# Patient Record
Sex: Male | Born: 1935 | Race: White | Hispanic: No | Smoking: Never smoker
Health system: Southern US, Community
[De-identification: ages and names within clinical notes are randomized; demographics above are authoritative.]

## PROBLEM LIST (undated history)

## (undated) DIAGNOSIS — F0390 Unspecified dementia without behavioral disturbance: Secondary | ICD-10-CM

## (undated) DIAGNOSIS — R011 Cardiac murmur, unspecified: Secondary | ICD-10-CM

## (undated) DIAGNOSIS — N400 Enlarged prostate without lower urinary tract symptoms: Secondary | ICD-10-CM

## (undated) DIAGNOSIS — E785 Hyperlipidemia, unspecified: Secondary | ICD-10-CM

## (undated) DIAGNOSIS — I10 Essential (primary) hypertension: Secondary | ICD-10-CM

## (undated) DIAGNOSIS — I2510 Atherosclerotic heart disease of native coronary artery without angina pectoris: Secondary | ICD-10-CM

## (undated) HISTORY — DX: Atherosclerotic heart disease of native coronary artery without angina pectoris: I25.10

## (undated) HISTORY — DX: Benign prostatic hyperplasia without lower urinary tract symptoms: N40.0

## (undated) HISTORY — DX: Essential (primary) hypertension: I10

## (undated) HISTORY — DX: Cardiac murmur, unspecified: R01.1

## (undated) HISTORY — PX: NO PAST SURGERIES: SHX2092

---

## 2012-12-14 ENCOUNTER — Ambulatory Visit: Payer: Self-pay

## 2013-07-27 ENCOUNTER — Encounter: Payer: Self-pay | Admitting: *Deleted

## 2013-08-08 ENCOUNTER — Ambulatory Visit: Payer: Self-pay | Admitting: Podiatry

## 2013-08-22 ENCOUNTER — Encounter: Payer: Self-pay | Admitting: Podiatry

## 2013-08-22 ENCOUNTER — Ambulatory Visit (INDEPENDENT_AMBULATORY_CARE_PROVIDER_SITE_OTHER): Payer: Medicare Other | Admitting: Podiatry

## 2013-08-22 VITALS — BP 145/92 | HR 65 | Resp 18 | Ht 70.87 in | Wt 165.0 lb

## 2013-08-22 DIAGNOSIS — B351 Tinea unguium: Secondary | ICD-10-CM

## 2013-08-22 DIAGNOSIS — M79609 Pain in unspecified limb: Secondary | ICD-10-CM

## 2013-08-22 NOTE — Progress Notes (Signed)
   Subjective:    Patient ID: Cody Chaney, male    DOB: [birth year], 78 y.o.   MRN: [id number]  HPI Pt is unaware of what the appointment is for, also caregiver that is with the patient has no idea why he is here, he is scheduled with another foot doctor next week  They think its for his painful long fungal toenails  Review of Systems     Objective:   Physical Exam I have reviewed his past history medications allergies surgical history. Review of systems noncontributory at this point. Pulses are palpable bilateral. Neurologic sensorium is intact bilateral. Deep tendon reflexes are intact bilateral. Muscle strength is 5 over 5 dorsiflexors plantar flexors inverters everters all intrinsic musculature is intact. Orthopedic evaluation demonstrates hammertoe deformities 1 through 5 bilateral. Nails are thick yellow dystrophic onychomycotic. Nails are also painful.        Assessment & Plan:  Assessment: Pain in limb secondary to onychomycosis 1 through 5 bilateral.  Plan: Debridement of nails 1 through 5 bilateral is cover service secondary to pain.

## 2013-11-21 ENCOUNTER — Ambulatory Visit (INDEPENDENT_AMBULATORY_CARE_PROVIDER_SITE_OTHER): Payer: Medicare Other | Admitting: Podiatry

## 2013-11-21 VITALS — Resp 16 | Ht 69.0 in | Wt 149.0 lb

## 2013-11-21 DIAGNOSIS — M79609 Pain in unspecified limb: Secondary | ICD-10-CM

## 2013-11-21 DIAGNOSIS — B351 Tinea unguium: Secondary | ICD-10-CM

## 2013-11-21 NOTE — Progress Notes (Signed)
He presents today with a chief complaint of painful elongated toenails.  Objective: Pulses are strongly palpable bilateral. Nails are thick yellow dystrophic with mycotic and painful palpation. There no open lesions.  Assessment: Pain in limb secondary to onychomycosis 1 through 5 bilateral.  Plan: Debridement of nails 1 through 5 bilateral covered service secondary to pain.

## 2014-03-04 ENCOUNTER — Ambulatory Visit: Payer: Medicare Other | Admitting: Podiatry

## 2015-12-01 ENCOUNTER — Emergency Department
Admission: EM | Admit: 2015-12-01 | Discharge: 2015-12-01 | Disposition: A | Discharge status: Home / Self Care | Payer: Medicare Other | Attending: Emergency Medicine | Admitting: Emergency Medicine

## 2015-12-01 ENCOUNTER — Emergency Department: Payer: Medicare Other

## 2015-12-01 ENCOUNTER — Encounter: Payer: Self-pay | Admitting: Emergency Medicine

## 2015-12-01 DIAGNOSIS — I2510 Atherosclerotic heart disease of native coronary artery without angina pectoris: Secondary | ICD-10-CM | POA: Diagnosis not present

## 2015-12-01 DIAGNOSIS — I10 Essential (primary) hypertension: Secondary | ICD-10-CM | POA: Diagnosis not present

## 2015-12-01 DIAGNOSIS — Z7982 Long term (current) use of aspirin: Secondary | ICD-10-CM | POA: Diagnosis not present

## 2015-12-01 DIAGNOSIS — Z79899 Other long term (current) drug therapy: Secondary | ICD-10-CM | POA: Diagnosis not present

## 2015-12-01 DIAGNOSIS — R531 Weakness: Secondary | ICD-10-CM | POA: Insufficient documentation

## 2015-12-01 DIAGNOSIS — F0390 Unspecified dementia without behavioral disturbance: Secondary | ICD-10-CM | POA: Diagnosis not present

## 2015-12-01 DIAGNOSIS — W19XXXA Unspecified fall, initial encounter: Secondary | ICD-10-CM

## 2015-12-01 DIAGNOSIS — N390 Urinary tract infection, site not specified: Secondary | ICD-10-CM

## 2015-12-01 LAB — URINALYSIS COMPLETE WITH MICROSCOPIC (ARMC ONLY)
Bilirubin Urine: NEGATIVE
GLUCOSE, UA: NEGATIVE mg/dL
Ketones, ur: NEGATIVE mg/dL
Nitrite: POSITIVE — AB
PH: 7 (ref 5.0–8.0)
PROTEIN: NEGATIVE mg/dL
SPECIFIC GRAVITY, URINE: 1.006 (ref 1.005–1.030)
SQUAMOUS EPITHELIAL / LPF: NONE SEEN

## 2015-12-01 LAB — CBC WITH DIFFERENTIAL/PLATELET
BASOS ABS: 0 10*3/uL (ref 0–0.1)
BASOS PCT: 0 %
Eosinophils Absolute: 0 10*3/uL (ref 0–0.7)
Eosinophils Relative: 0 %
HEMATOCRIT: 36.3 % — AB (ref 40.0–52.0)
Hemoglobin: 12.1 g/dL — ABNORMAL LOW (ref 13.0–18.0)
LYMPHS ABS: 1.2 10*3/uL (ref 1.0–3.6)
LYMPHS PCT: 10 %
MCH: 29 pg (ref 26.0–34.0)
MCHC: 33.3 g/dL (ref 32.0–36.0)
MCV: 87.3 fL (ref 80.0–100.0)
MONOS PCT: 5 %
Monocytes Absolute: 0.6 10*3/uL (ref 0.2–1.0)
NEUTROS ABS: 9.7 10*3/uL — AB (ref 1.4–6.5)
Neutrophils Relative %: 85 %
PLATELETS: 190 10*3/uL (ref 150–440)
RBC: 4.16 MIL/uL — ABNORMAL LOW (ref 4.40–5.90)
RDW: 15.7 % — AB (ref 11.5–14.5)
WBC: 11.5 10*3/uL — ABNORMAL HIGH (ref 3.8–10.6)

## 2015-12-01 LAB — COMPREHENSIVE METABOLIC PANEL
ALBUMIN: 3.5 g/dL (ref 3.5–5.0)
ALT: 15 U/L — AB (ref 17–63)
AST: 26 U/L (ref 15–41)
Alkaline Phosphatase: 87 U/L (ref 38–126)
Anion gap: 8 (ref 5–15)
BILIRUBIN TOTAL: 0.7 mg/dL (ref 0.3–1.2)
BUN: 20 mg/dL (ref 6–20)
CHLORIDE: 107 mmol/L (ref 101–111)
CO2: 28 mmol/L (ref 22–32)
CREATININE: 1.01 mg/dL (ref 0.61–1.24)
Calcium: 9 mg/dL (ref 8.9–10.3)
GFR calc Af Amer: >60 mL/min (ref >60)
GLUCOSE: 96 mg/dL (ref 65–99)
Potassium: 4.3 mmol/L (ref 3.5–5.1)
Sodium: 143 mmol/L (ref 135–145)
Total Protein: 7.5 g/dL (ref 6.5–8.1)

## 2015-12-01 LAB — TROPONIN I: Troponin I: <0.03 ng/mL (ref <0.031)

## 2015-12-01 LAB — BRAIN NATRIURETIC PEPTIDE: B NATRIURETIC PEPTIDE 5: 114 pg/mL — AB (ref 0.0–100.0)

## 2015-12-01 MED ORDER — CEPHALEXIN 500 MG PO CAPS: 500.0000 mg | ORAL_CAPSULE | Freq: Four times a day (QID) | ORAL | Status: DC

## 2015-12-01 MED ORDER — SODIUM CHLORIDE 0.9 % IV BOLUS (SEPSIS)
500.0000 mL | Freq: Once | INTRAVENOUS | Status: AC
  Administered 2015-12-01: 500 mL via INTRAVENOUS

## 2015-12-01 MED ORDER — SODIUM CHLORIDE 0.9 % IV BOLUS (SEPSIS)
500.0000 mL | Freq: Once | INTRAVENOUS | Status: AC
Start: 2015-12-01 — End: 2015-12-01
  Administered 2015-12-01: 500 mL via INTRAVENOUS

## 2015-12-01 MED ORDER — CEPHALEXIN 500 MG PO CAPS: 500.0000 mg | ORAL_CAPSULE | Freq: Three times a day (TID) | ORAL | Status: AC

## 2015-12-01 MED ORDER — DEXTROSE 5 % IV SOLN
1.0000 g | Freq: Once | INTRAVENOUS | Status: AC
  Administered 2015-12-01: 1 g via INTRAVENOUS
  Filled 2015-12-01: qty 10

## 2015-12-01 NOTE — ED Notes (Signed)
Pts transportion arrived and discharge instructions as well as prescription given.

## 2015-12-01 NOTE — ED Notes (Signed)
Pt fell this am, no obvious injuries and denies pain.  Skin w/d, lungs clear, MAE. Care home reported to EMS that pt seemed more weak over the past few days.

## 2015-12-01 NOTE — ED Provider Notes (Addendum)
[facility] Emergency Department Provider Note  ____________________________________________   I have reviewed the triage vital signs and the nursing notes.   HISTORY  Chief Complaint Weakness    HPI Cody Chaney is a 80 y.o. male presents today after having a non-syncopal fall. Apparently, he fell without any known injury. It appears as if this is a mechanical fall. Patient does have significant dementia and is at his baseline according to EMS. He himself cannot give much more of a history. No complaints of pain.  Level 5 chart caveat; no further history available due to patient status.     Past Medical History  Diagnosis Date  . Hypertension   . Hypertrophy (benign) of prostate   . Heart murmur   . Coronary artery disease     There are no active problems to display for this patient.   History reviewed. No pertinent past surgical history.  Current Outpatient Rx  Name  Route  Sig  Dispense  Refill  . amLODipine (NORVASC) 2.5 MG tablet   Oral   Take 2.5 mg by mouth daily.         Marland Kitchen amlodipine-benazepril (LOTREL) 2.5-10 MG per capsule   Oral   Take 1 capsule by mouth daily.         Marland Kitchen aspirin 81 MG tablet   Oral   Take 81 mg by mouth daily.         Marland Kitchen atorvastatin (LIPITOR) 40 MG tablet   Oral   Take 40 mg by mouth daily.         . bacitracin ointment   Topical   Apply 1 application topically 2 (two) times daily.         . metoprolol tartrate (LOPRESSOR) 25 MG tablet   Oral   Take 25 mg by mouth 2 (two) times daily.           Allergies Review of patient's allergies indicates no known allergies.  History reviewed. No pertinent family history.  Social History Social History  Substance Use Topics  . Smoking status: Never Smoker   . Smokeless tobacco: Never Used  . Alcohol Use: No    Review of Systems Level 5 chart caveat; no further history available due to patient  status. ____________________________________________   PHYSICAL EXAM:  VITAL SIGNS: ED Triage Vitals  Enc Vitals Group     BP --      Pulse Rate 12/01/15 1015 68     Resp 12/01/15 1015 16     Temp 12/01/15 1015 97.9 F (36.6 C)     Temp Source 12/01/15 1015 Oral     SpO2 12/01/15 1015 97 %     Weight 12/01/15 1015 150 lb (68.04 kg)     Height 12/01/15 1015  (1.88 m)     Head Cir --      Peak Flow --      Pain Score 12/01/15 1017 0     Pain Loc --      Pain Edu? --      Excl. in GC? --     Constitutional: Alert and orientedTo name unsure of the date pleasantly demented. Well appearing and in no acute distress. Eyes: Conjunctivae are normal. PERRL. EOMI. Head: Atraumatic. Nose: No congestion/rhinnorhea. Mouth/Throat: Mucous membranes are moist.  Oropharynx non-erythematous. Neck: No stridor.   Nontender with no meningismus Cardiovascular: Normal rate, regular rhythm. Grossly normal heart sounds.  Good peripheral circulation. Respiratory: Normal respiratory effort.  No retractions. Lungs  CTAB. Abdominal: Soft and nontender. No distention. No guarding no rebound Back:  There is no focal tenderness or step off there is no midline tenderness there are no lesions noted. there is no CVA tenderness Musculoskeletal: No lower extremity tenderness. No joint effusions, no DVT signs strong distal pulses no edema Neurologic:  Normal speech and language. No gross focal neurologic deficits are appreciated.  Skin:  Skin is warm, dry and intact. No rash noted. Psychiatric: Mood and affect are normal. Speech and behavior are normal.  ____________________________________________   LABS (all labs ordered are listed, but only abnormal results are displayed)  Labs Reviewed  TROPONIN I  COMPREHENSIVE METABOLIC PANEL  BRAIN NATRIURETIC PEPTIDE  CBC WITH DIFFERENTIAL/PLATELET  URINALYSIS COMPLETEWITH MICROSCOPIC ([facility] ONLY)   ____________________________________________  EKG  I  personally interpreted any EKGs ordered by me or triage Sinus rhythm, rate 64 no acute ST elevation or depression likely repolarization abnormality noted, borderline LAD, PR is 275. ____________________________________________  RADIOLOGY  I reviewed any imaging ordered by me or triage that were performed during my shift and, if possible, patient and/or family made aware of any abnormal findings. ____________________________________________   PROCEDURES  Procedure(s) performed: None  Critical Care performed: None  ____________________________________________   INITIAL IMPRESSION / ASSESSMENT AND PLAN / ED COURSE  Pertinent labs & imaging results that were available during my care of the patient were reviewed by me and considered in my medical decision making (see chart for details).  Patient here after a fall at the nursing facility. He is demented And further history, however, there is no obvious injury, can flex and extend his hips with no difficulty, no evidence of discomfort, no evidence of closed head injury but given his dementia is very difficult to determine we will obtain a CT of his head because there is report that he may be "weaker" recently we'll check a urine and basic blood work and reassess.  ----------------------------------------- 1:27 PM on 12/01/2015 -----------------------------------------  Has uti,. Family at bedside. Feel he is at his baseline. Request that we discharge him. Feel he is safe for that. I see no acute pathology that would require admission at this time. Has no complaints. Vss. Blood work reassuring.  ____________________________________________   FINAL CLINICAL IMPRESSION(S) / ED DIAGNOSES  Final diagnoses:  None      This chart was dictated using voice recognition software.  Despite best efforts to proofread,  errors can occur which can change meaning.     Jeanmarie PlantJames A McShane, MD 12/01/15 1043  Jeanmarie PlantJames A McShane, MD 12/01/15 [phone]

## 2015-12-01 NOTE — ED Notes (Signed)
Pt change and dressed. Report given to pts group home and transportation in route. Pt stable and able to stand and pivit without difficulty.

## 2015-12-01 NOTE — ED Notes (Signed)
Reports generalized weakness over the weekend per staff at [facility].  Pt fell around 6am.  Denies compliants

## 2015-12-01 NOTE — ED Notes (Signed)
Patient transported to CT 

## 2015-12-03 LAB — URINE CULTURE

## 2015-12-19 ENCOUNTER — Encounter: Payer: Self-pay | Admitting: Emergency Medicine

## 2015-12-19 ENCOUNTER — Emergency Department
Admission: EM | Admit: 2015-12-19 | Discharge: 2015-12-19 | Disposition: A | Discharge status: Home / Self Care | Payer: Medicare Other | Attending: Emergency Medicine | Admitting: Emergency Medicine

## 2015-12-19 DIAGNOSIS — I2510 Atherosclerotic heart disease of native coronary artery without angina pectoris: Secondary | ICD-10-CM | POA: Diagnosis not present

## 2015-12-19 DIAGNOSIS — B029 Zoster without complications: Secondary | ICD-10-CM | POA: Diagnosis not present

## 2015-12-19 DIAGNOSIS — Z7982 Long term (current) use of aspirin: Secondary | ICD-10-CM | POA: Insufficient documentation

## 2015-12-19 DIAGNOSIS — I10 Essential (primary) hypertension: Secondary | ICD-10-CM | POA: Insufficient documentation

## 2015-12-19 DIAGNOSIS — Z79899 Other long term (current) drug therapy: Secondary | ICD-10-CM | POA: Insufficient documentation

## 2015-12-19 DIAGNOSIS — E785 Hyperlipidemia, unspecified: Secondary | ICD-10-CM | POA: Insufficient documentation

## 2015-12-19 DIAGNOSIS — R21 Rash and other nonspecific skin eruption: Secondary | ICD-10-CM | POA: Diagnosis present

## 2015-12-19 HISTORY — DX: Hyperlipidemia, unspecified: E78.5

## 2015-12-19 HISTORY — DX: Unspecified dementia, unspecified severity, without behavioral disturbance, psychotic disturbance, mood disturbance, and anxiety: F03.90

## 2015-12-19 MED ORDER — VALACYCLOVIR HCL 1 G PO TABS: 1000.0000 mg | ORAL_TABLET | Freq: Three times a day (TID) | ORAL | Status: AC

## 2015-12-19 NOTE — ED Notes (Signed)
Patient caregiver unable to get out of vehicle, hard copy of discharge paper signed.

## 2015-12-19 NOTE — ED Notes (Signed)
Attempted to call Cody Chaney, No answer at [phone], no answering machine

## 2015-12-19 NOTE — ED Notes (Signed)
Patient brief changed.

## 2015-12-19 NOTE — ED Notes (Addendum)
Attempted to calls [facility], no answer, answering machine has not been set up, unable to leave message  [phone]

## 2015-12-19 NOTE — ED Notes (Signed)
Spoke with Lucienne CapersGrace [facility] and gave verbal report on patient. Delorise ShinerGrace will come to pick the patient up.

## 2015-12-19 NOTE — ED Notes (Signed)
Left message for Cody Chaney to call us back  [phone]

## 2015-12-19 NOTE — ED Notes (Signed)
BP in RLE 140/71

## 2015-12-19 NOTE — ED Notes (Signed)
Patient brought in by [facility] from [facility] for evaluation of an unknown rash on his right thigh that extends down his leg. Patient was evaluated by his PCP yesterday who noted patient had a rash that "appeared to be shingles", however patient was not treated.

## 2015-12-19 NOTE — ED Provider Notes (Signed)
[facility] Emergency Department Provider Note   L5 caveat: Review of systems and history is limited by dementia     Time seen: ----------------------------------------- 8:52 AM on 12/19/2015 -----------------------------------------    I have reviewed the triage vital signs and the nursing notes.   HISTORY  Chief Complaint Rash    HPI Cody Chaney is a 80 y.o. male who presents ER after a rash was noted on his right leg. Patient evidently was evaluated by his primary care doctor yesterday who noted the rash but did not initiate treatment. Patient denies any complaints at this time. It is unclear how long the rash has been there, patient cannot give a history.   Past Medical History  Diagnosis Date  . Hypertension   . Hypertrophy (benign) of prostate   . Heart murmur   . Coronary artery disease     There are no active problems to display for this patient.   No past surgical history on file.  Allergies Review of patient's allergies indicates no known allergies.  Social History Social History  Substance Use Topics  . Smoking status: Never Smoker   . Smokeless tobacco: Never Used  . Alcohol Use: No    Review of Systems Review of systems is otherwise unknown Skin: Positive for right leg rash   ____________________________________________   PHYSICAL EXAM:  VITAL SIGNS: ED Triage Vitals  Enc Vitals Group     BP --      Pulse --      Resp --      Temp --      Temp src --      SpO2 --      Weight --      Height --      Head Cir --      Peak Flow --      Pain Score --      Pain Loc --      Pain Edu? --      Excl. in GC? --     Constitutional: Alert, No acute distress Eyes: Conjunctivae are normal. Normal extraocular movements. Cardiovascular: Normal rate, regular rhythm. Systolic murmur noted Respiratory: Normal respiratory effort without tachypnea nor retractions. Breath sounds are clear and equal bilaterally. No  wheezes/rales/rhonchi. Musculoskeletal: Nontender with limited but unremarkable range of motion. Obvious rash noted to the right lower extremity Neurologic:  Normal speech and language. No gross focal neurologic deficits are appreciated.  Skin:  Erythematous lesions with blister formation and possibly purpura noted to the right leg in the L3 dermatome Psychiatric: Mood and affect are normal. Speech and behavior are normal.  ____________________________________________  ED COURSE:  Pertinent labs & imaging results that were available during my care of the patient were reviewed by me and considered in my medical decision making (see chart for details). Patient appears to have shingles developing left leg. There are some lesions outside the L3 dermatome of unclear etiology.  ____________________________________________  FINAL ASSESSMENT AND PLAN  Shingles  Plan: Patient with labs and imaging as dictated above. Patient with a unilateral rash most resembling shingles. He'll be placed on antiviral medicine and prednisone and encouraged to have close follow-up with his doctor. Patient has normal ABIs on the right. This could be vasculitis but it looks more like shingles.   Emily FilbertWilliams, Jonathan E, MD   Note: This dictation was prepared with Dragon dictation. Any transcriptional errors that result from this process are unintentional   Emily FilbertJonathan E Williams, MD 12/19/15 1119

## 2015-12-19 NOTE — Discharge Instructions (Signed)

## 2016-01-27 ENCOUNTER — Emergency Department: Payer: Medicare Other

## 2016-01-27 ENCOUNTER — Encounter: Payer: Self-pay | Admitting: Emergency Medicine

## 2016-01-27 ENCOUNTER — Other Ambulatory Visit: Payer: Self-pay

## 2016-01-27 ENCOUNTER — Emergency Department
Admission: EM | Admit: 2016-01-27 | Discharge: 2016-01-27 | Disposition: A | Discharge status: Home / Self Care | Payer: Medicare Other | Attending: Emergency Medicine | Admitting: Emergency Medicine

## 2016-01-27 DIAGNOSIS — I2510 Atherosclerotic heart disease of native coronary artery without angina pectoris: Secondary | ICD-10-CM | POA: Insufficient documentation

## 2016-01-27 DIAGNOSIS — I639 Cerebral infarction, unspecified: Secondary | ICD-10-CM | POA: Diagnosis not present

## 2016-01-27 DIAGNOSIS — E785 Hyperlipidemia, unspecified: Secondary | ICD-10-CM | POA: Insufficient documentation

## 2016-01-27 DIAGNOSIS — Z7982 Long term (current) use of aspirin: Secondary | ICD-10-CM | POA: Diagnosis not present

## 2016-01-27 DIAGNOSIS — I10 Essential (primary) hypertension: Secondary | ICD-10-CM | POA: Diagnosis not present

## 2016-01-27 DIAGNOSIS — Z791 Long term (current) use of non-steroidal anti-inflammatories (NSAID): Secondary | ICD-10-CM | POA: Diagnosis not present

## 2016-01-27 DIAGNOSIS — R001 Bradycardia, unspecified: Secondary | ICD-10-CM | POA: Insufficient documentation

## 2016-01-27 DIAGNOSIS — Z79899 Other long term (current) drug therapy: Secondary | ICD-10-CM | POA: Insufficient documentation

## 2016-01-27 DIAGNOSIS — R531 Weakness: Secondary | ICD-10-CM | POA: Insufficient documentation

## 2016-01-27 LAB — URINALYSIS COMPLETE WITH MICROSCOPIC (ARMC ONLY)
BILIRUBIN URINE: NEGATIVE
Glucose, UA: NEGATIVE mg/dL
Ketones, ur: NEGATIVE mg/dL
Nitrite: POSITIVE — AB
PH: 5 (ref 5.0–8.0)
PROTEIN: NEGATIVE mg/dL
Specific Gravity, Urine: 1.006 (ref 1.005–1.030)
Squamous Epithelial / LPF: NONE SEEN

## 2016-01-27 LAB — COMPREHENSIVE METABOLIC PANEL
ALK PHOS: 101 U/L (ref 38–126)
ALT: 19 U/L (ref 17–63)
AST: 34 U/L (ref 15–41)
Albumin: 3.5 g/dL (ref 3.5–5.0)
Anion gap: 8 (ref 5–15)
BILIRUBIN TOTAL: 0.5 mg/dL (ref 0.3–1.2)
BUN: 16 mg/dL (ref 6–20)
CALCIUM: 9.3 mg/dL (ref 8.9–10.3)
CO2: 29 mmol/L (ref 22–32)
CREATININE: 1.01 mg/dL (ref 0.61–1.24)
Chloride: 106 mmol/L (ref 101–111)
Glucose, Bld: 109 mg/dL — ABNORMAL HIGH (ref 65–99)
Potassium: 4.9 mmol/L (ref 3.5–5.1)
Sodium: 143 mmol/L (ref 135–145)
TOTAL PROTEIN: 7.8 g/dL (ref 6.5–8.1)

## 2016-01-27 LAB — CBC WITH DIFFERENTIAL/PLATELET
Basophils Absolute: 0.1 10*3/uL (ref 0–0.1)
Basophils Relative: 1 %
Eosinophils Absolute: 0 10*3/uL (ref 0–0.7)
Eosinophils Relative: 0 %
HEMATOCRIT: 35.9 % — AB (ref 40.0–52.0)
HEMOGLOBIN: 12.1 g/dL — AB (ref 13.0–18.0)
LYMPHS ABS: 0.7 10*3/uL — AB (ref 1.0–3.6)
MCH: 29.5 pg (ref 26.0–34.0)
MCHC: 33.6 g/dL (ref 32.0–36.0)
MCV: 87.7 fL (ref 80.0–100.0)
Monocytes Absolute: 0.4 10*3/uL (ref 0.2–1.0)
Neutro Abs: 10.1 10*3/uL — ABNORMAL HIGH (ref 1.4–6.5)
Platelets: 231 10*3/uL (ref 150–440)
RBC: 4.1 MIL/uL — AB (ref 4.40–5.90)
RDW: 16.2 % — ABNORMAL HIGH (ref 11.5–14.5)
WBC: 11.2 10*3/uL — AB (ref 3.8–10.6)

## 2016-01-27 LAB — TROPONIN I: Troponin I: <0.03 ng/mL (ref <0.03)

## 2016-01-27 NOTE — ED Notes (Signed)
Ms Dutch [facility] arrived to pick pt up, present at discharge along with son. No further questions per either. Pt changed and taken out to car via wheelchair, pt tolerated well, NAD noted.

## 2016-01-27 NOTE — ED Notes (Signed)
Patient transported to CT 

## 2016-01-27 NOTE — ED Notes (Signed)
Called report to [facility] at [facility]. She will be here to transport patient home.

## 2016-01-27 NOTE — ED Notes (Signed)
Pt here from [facility] in [place]. EMS reports pt's LKW was 1 week ago, pt with increased weakness on left side and is unable to walk.

## 2016-01-27 NOTE — ED Notes (Signed)
Per staff (Ms. [facility]) at rest home, patient was at baseline until 2 days ago. Patient needed help getting out of bed which is unusual for him. Staff also states that patient has had decreased  PO intake. States patient is normally ambulatory without any assist.

## 2016-01-27 NOTE — Clinical Social Work Note (Signed)
Clinical Social Work Assessment  Patient Details  Name: [name] E Stopper MRN: [id number] Date of Birth: 1935-12-09  Date of referral:  01/27/16               Reason for consult:  Facility Placement                Permission sought to share information with:  Guardian Permission granted to share information::  Yes, Verbal Permission Granted  Name::      Lucienne CapersGrace [facility] [phone]  Agency::  [facility]   Relationship::  Tammy Talmadge [place] and Merrie RoofStanley [place]on (daughter-in-law and son)  SolicitorContact Information:  [phone] Babette Relic(Tammy) and [phone] Duffy Rhody(Stanley)  Housing/Transportation Living arrangements for the past 2 months:  Assisted Living Facility Source of Information:  Facility Patient Interpreter Needed:    Criminal Activity/Legal Involvement Pertinent to Current Situation/Hospitalization:    Significant Relationships:  Adult Children Lives with:  Facility Resident Do you feel safe going back to the place where you live?  Yes Need for family participation in patient care:     Care giving concerns:  No care giving concerns at this time.   Social Worker assessment / plan: Pt was admitted to ED with recent weakness on the left side of his body and lethargy. CSW engaged with pt at his bedside and introduced herself and her role as a Child psychotherapistsocial worker. Pt was able to respond to simple questions with "yes or no" answers, but appeared to be very confused and had limited comprehension. Pt is having trouble ambulating and speaking, however, pt's hearing seems to be okay. CSW obtained verbal consent from pt to retrieve and share information with [facility] and with pt's family. CSW contacted [facility] and spoke with Lucienne CapersGrace [facility] [phone] via telephone. Ms. Dutch [facility]ole stated that pt has recently been having trouble walking, has been falling out of his bed, and has stopped eating and drinking. Ms. Dutch Quintoole was unsure if pt has HPOA but stated that pt's son and daughter-in-law frequently "look  after pt". CSW will continue to assess as needed. Pt has Medicaid/Medicare.  Employment status:  Disabled (Comment on whether or not currently receiving Disability) (Pt has dementia) Insurance information:    PT Recommendations:  Not assessed at this time Information / Referral to community resources:   (No referrals or community resources were needed at this time)  Patient/Family's Response to care:  NO concerns reported at this time.  Patient/Family's Understanding of and Emotional Response to Diagnosis, Current Treatment, and Prognosis:  Pt's caregivers have a clear understanding of pts condition.  Emotional Assessment Appearance:  Appears stated age Attitude/Demeanor/Rapport:  Lethargic Affect (typically observed):  Other (Confused ) Orientation:  Fluctuating Orientation (Suspected and/or reported Sundowners) (Pt has dementia) Alcohol / Substance use:  Not Applicable Psych involvement (Current and /or in the community):  No (Comment)  Discharge Needs  Concerns to be addressed:    Readmission within the last 30 days:  No Current discharge risk:  None Barriers to Discharge:  No Barriers Identified   Cheron SchaumannBandi, Juanmiguel Defelice M, LCSW 01/27/2016, 11:55 AM

## 2016-01-27 NOTE — Discharge Instructions (Signed)
Weakness Weakness is a lack of strength. It may be felt all over the body (generalized) or in one specific part of the body (focal). Some causes of weakness can be serious. You may need further medical evaluation, especially if you are elderly or you have a history of immunosuppression (such as chemotherapy or HIV), kidney disease, heart disease, or diabetes. CAUSES  Weakness can be caused by many different things, including:  Infection.  Physical exhaustion.  Internal bleeding or other blood loss that results in a lack of red blood cells (anemia).  Dehydration. This cause is more common in elderly people.  Side effects or electrolyte abnormalities from medicines, such as pain medicines or sedatives.  Emotional distress, anxiety, or depression.  Circulation problems, especially severe peripheral arterial disease.  Heart disease, such as rapid atrial fibrillation, bradycardia, or heart failure.  Nervous system disorders, such as Guillain-Barr syndrome, multiple sclerosis, or stroke. DIAGNOSIS  To find the cause of your weakness, your caregiver will take your history and perform a physical exam. Lab tests or X-rays may also be ordered, if needed. TREATMENT  Treatment of weakness depends on the cause of your symptoms and can vary greatly. HOME CARE INSTRUCTIONS   Rest as needed.  Eat a well-balanced diet.  Try to get some exercise every day.  Only take over-the-counter or prescription medicines as directed by your caregiver. SEEK MEDICAL CARE IF:   Your weakness seems to be getting worse or spreads to other parts of your body.  You develop new aches or pains. SEEK IMMEDIATE MEDICAL CARE IF:   You cannot perform your normal daily activities, such as getting dressed and feeding yourself.  You cannot walk up and down stairs, or you feel exhausted when you do so.  You have shortness of breath or chest pain.  You have difficulty moving parts of your body.  You have weakness  in only one area of the body or on only one side of the body.  You have a fever.  You have trouble speaking or swallowing.  You cannot control your bladder or bowel movements.  You have black or bloody vomit or stools. MAKE SURE YOU:  Understand these instructions.  Will watch your condition.  Will get help right away if you are not doing well or get worse.   This information is not intended to replace advice given to you by your health care provider. Make sure you discuss any questions you have with your health care provider.   Document Released: 07/19/2005 Document Revised: 01/18/2012 Document Reviewed: 09/17/2011 Elsevier Interactive Patient Education 2016 Elsevier Inc.  Bradycardia Bradycardia is a slower-than-normal heart rate. A normal resting heart rate for an adult ranges from 60 to 100 beats per minute. With bradycardia, the resting heart rate is less than 60 beats per minute. Bradycardia is a problem if your heart cannot pump enough oxygen-rich blood through your body. Bradycardia is not a problem for everyone. For some healthy adults, a slow resting heart rate is normal.  CAUSES  Bradycardia may be caused by:  A problem with the heart's electrical system, such as heart block.  A problem with the heart's natural pacemaker (sinus node).  Heart disease, damage, or infection.  Certain medicines that treat heart conditions.  Certain conditions, such as hypothyroidism and obstructive sleep apnea. RISK FACTORS  Risk factors include:  Being 3265 or older.  Having high blood pressure (hypertension), high cholesterol (hyperlipidemia), or diabetes.  Drinking heavily, using tobacco products, or using drugs.  Being  stressed. SIGNS AND SYMPTOMS  Signs and symptoms include:  Light-headedness.  Faintingor near fainting.  Fatigue and weakness.  Shortness of breath.  Chest pain (angina).  Drowsiness.  Confusion.  Dizziness. DIAGNOSIS  Diagnosis of  bradycardia may include:  A physical exam.  An electrocardiogram (ECG).  Blood tests. TREATMENT  Treatment for bradycardia may include:  Treatment of an underlying condition.  Pacemaker placement. A pacemaker is a small, battery-powered device that is placed under the skin and is programmed to sense your heartbeats. If your heart rate is lower than the programmed rate, the pacemaker will pace your heart.  Changing your medicines or dosages. HOME CARE INSTRUCTIONS  Take medicines only as directed by your health care provider.  Manage any health conditions that contribute to bradycardia as directed by your health care provider.  Follow a heart-healthy diet. A dietitian can help educate you on healthy food options and changes.  Follow an exercise program approved by your health care provider.  Maintain a healthy weight. Lose weight as approved by your health care provider.  Do not use tobacco products, including cigarettes, chewing tobacco, or electronic cigarettes. If you need help quitting, ask your health care provider.  Do not use illegal drugs.  Limit alcohol intake to no more than 1 drink per day for nonpregnant women and 2 drinks per day for men. One drink equals 12 ounces of beer, 5 ounces of wine, or 1 ounces of hard liquor.  Keep all follow-up visits as directed by your health care provider. This is important. SEEK MEDICAL CARE IF:  You feel light-headed or dizzy.  You almost faint.  You feel weak or are easily fatigued during physical activity.  You experience confusion or have memory problems. SEEK IMMEDIATE MEDICAL CARE IF:   You faint.  You have an irregular heartbeat.  You have chest pain.  You have trouble breathing. MAKE SURE YOU:   Understand these instructions.  Will watch your condition.  Will get help right away if you are not doing well or get worse.   This information is not intended to replace advice given to you by your health care  provider. Make sure you discuss any questions you have with your health care provider.   Document Released: 04/10/2002 Document Revised: 08/09/2014 Document Reviewed: 10/24/2013 Elsevier Interactive Patient Education [employer]2016 Elsevier Inc.

## 2016-01-27 NOTE — ED Notes (Signed)
Pt bradycardic 38-40 when sleeping. No distress noted.

## 2016-01-27 NOTE — ED Provider Notes (Signed)
[facility] Emergency Department Provider Note        Time seen: ----------------------------------------- 10:09 AM on 01/27/2016 -----------------------------------------  L5 caveat: Review of systems and history is limited by dementia  I have reviewed the triage vital signs and the nursing notes.   HISTORY  Chief Complaint Weakness    HPI Cody Chaney is a 80 y.o. male who presents to ER for weakness. He is currently a resident at a nursing home and has been reportedly more weak than normal. Patient was having difficulty walking with a cane. No information or review of systems is available due to his dementia.Reportedly his weakness has increased particularly on his left side and is unable to walk.   Past Medical History  Diagnosis Date  . Hypertension   . Hypertrophy (benign) of prostate   . Heart murmur   . Coronary artery disease   . Dementia   . Hyperlipemia     There are no active problems to display for this patient.   No past surgical history on file.  Allergies Review of patient's allergies indicates no known allergies.  Social History Social History  Substance Use Topics  . Smoking status: Never Smoker   . Smokeless tobacco: Never Used  . Alcohol Use: No    Review of Systems Reportedly positive for weakness  ____________________________________________   PHYSICAL EXAM:  VITAL SIGNS: ED Triage Vitals  Enc Vitals Group     BP --      Pulse --      Resp --      Temp --      Temp src --      SpO2 --      Weight --      Height --      Head Cir --      Peak Flow --      Pain Score --      Pain Loc --      Pain Edu? --      Excl. in GC? --     Constitutional: Alert But disoriented, no acute distress Eyes: Conjunctivae are normal. PERRL. Normal extraocular movements. ENT   Head: Normocephalic and atraumatic.   Nose: No congestion/rhinnorhea.   Mouth/Throat: Mucous membranes are moist.    Neck: No stridor. Cardiovascular: Normal rate, regular rhythm. No murmurs, rubs, or gallops. Respiratory: Normal respiratory effort without tachypnea nor retractions. Breath sounds are clear and equal bilaterally. No wheezes/rales/rhonchi. Gastrointestinal: Soft and nontender. Normal bowel sounds Musculoskeletal: Nontender, limited but unremarkable range of motion of the extremities Neurologic:  Normal speech and language. No gross focal neurologic deficits are appreciated.  Skin:  Skin is warm, dry and intact. No rash noted. Psychiatric: Mood and affect are normal. Speech and behavior are normal.  ____________________________________________  EKG: Interpreted by me. Sinus bradycardia with a rate of 44 bpm, first-degree AV block, normal QRS, normal QT interval. Flat T waves, likely LVH  ____________________________________________  ED COURSE:  Pertinent labs & imaging results that were available during my care of the patient were reviewed by me and considered in my medical decision making (see chart for details). Patient presents to ER with weakness. We will check basic labs and imaging. ____________________________________________    LABS (pertinent positives/negatives)  Labs Reviewed  CBC WITH DIFFERENTIAL/PLATELET - Abnormal; Notable for the following:    WBC 11.2 (*)    RBC 4.10 (*)    Hemoglobin 12.1 (*)    HCT 35.9 (*)    RDW 16.2 (*)  Neutro Abs 10.1 (*)    Lymphs Abs 0.7 (*)    All other components within normal limits  COMPREHENSIVE METABOLIC PANEL - Abnormal; Notable for the following:    Glucose, Bld 109 (*)    All other components within normal limits  URINALYSIS COMPLETEWITH MICROSCOPIC ([facility] ONLY) - Abnormal; Notable for the following:    Color, Urine YELLOW (*)    APPearance CLEAR (*)    Hgb urine dipstick 1+ (*)    Nitrite POSITIVE (*)    Leukocytes, UA 1+ (*)    Bacteria, UA MANY (*)    All other components within normal limits  TROPONIN I     RADIOLOGY Images were viewed by me  CT head IMPRESSION: 1. No acute finding or change from prior. 2. Advanced cortical atrophy. ____________________________________________  FINAL ASSESSMENT AND PLAN  Weakness  Plan: Patient with labs and imaging as dictated above. Patient is in no acute distress, he has had some bradycardia here but no other cause for weakness is found.   Cody Chaney, Cody Pruden E, MD   Note: This dictation was prepared with Dragon dictation. Any transcriptional errors that result from this process are unintentional   Cody FilbertJonathan Chaney Courtni Balash, MD 01/27/16 1232

## 2016-01-30 LAB — URINE CULTURE
Culture: >=100000 — AB
Special Requests: NORMAL

## 2016-01-31 NOTE — Progress Notes (Signed)
Dr. Sharyn CreamerMark Quale authorized a prescription for cephalexin 500 mg bid x 10 days for E coli UTI. Patient is a resident of [facility]. Spoke to group home wants prescription called in to [facility] [place] on Monday (Ph: [phone]). Group home declined having prescription sent to a pharmacy that would be opened Sunday.   Luisa HartScott Eros Montour, PharmD Clinical Pharmacist

## 2016-06-02 ENCOUNTER — Inpatient Hospital Stay
Admission: EM | Admit: 2016-06-02 | Discharge: 2016-06-05 | DRG: 690 | Disposition: A | Discharge status: Skilled Nursing Facility | Payer: Medicare Other | Attending: Internal Medicine | Admitting: Internal Medicine

## 2016-06-02 ENCOUNTER — Emergency Department: Payer: Medicare Other

## 2016-06-02 ENCOUNTER — Encounter: Payer: Self-pay | Admitting: Emergency Medicine

## 2016-06-02 DIAGNOSIS — N400 Enlarged prostate without lower urinary tract symptoms: Secondary | ICD-10-CM | POA: Diagnosis present

## 2016-06-02 DIAGNOSIS — M6281 Muscle weakness (generalized): Secondary | ICD-10-CM

## 2016-06-02 DIAGNOSIS — R4182 Altered mental status, unspecified: Secondary | ICD-10-CM | POA: Diagnosis present

## 2016-06-02 DIAGNOSIS — R011 Cardiac murmur, unspecified: Secondary | ICD-10-CM | POA: Diagnosis present

## 2016-06-02 DIAGNOSIS — N390 Urinary tract infection, site not specified: Secondary | ICD-10-CM | POA: Diagnosis present

## 2016-06-02 DIAGNOSIS — Z7982 Long term (current) use of aspirin: Secondary | ICD-10-CM | POA: Diagnosis not present

## 2016-06-02 DIAGNOSIS — F0390 Unspecified dementia without behavioral disturbance: Secondary | ICD-10-CM | POA: Diagnosis present

## 2016-06-02 DIAGNOSIS — I2510 Atherosclerotic heart disease of native coronary artery without angina pectoris: Secondary | ICD-10-CM | POA: Diagnosis present

## 2016-06-02 DIAGNOSIS — E785 Hyperlipidemia, unspecified: Secondary | ICD-10-CM | POA: Diagnosis present

## 2016-06-02 DIAGNOSIS — R262 Difficulty in walking, not elsewhere classified: Secondary | ICD-10-CM

## 2016-06-02 DIAGNOSIS — I10 Essential (primary) hypertension: Secondary | ICD-10-CM | POA: Diagnosis present

## 2016-06-02 DIAGNOSIS — Z79899 Other long term (current) drug therapy: Secondary | ICD-10-CM | POA: Diagnosis not present

## 2016-06-02 DIAGNOSIS — G9340 Encephalopathy, unspecified: Secondary | ICD-10-CM | POA: Diagnosis present

## 2016-06-02 LAB — TROPONIN I
TROPONIN I: 0.03 ng/mL — AB (ref <0.03)
Troponin I: 0.04 ng/mL (ref <0.03)

## 2016-06-02 LAB — CBC WITH DIFFERENTIAL/PLATELET
BASOS ABS: 0.1 10*3/uL (ref 0–0.1)
Basophils Relative: 1 %
EOS ABS: 0.1 10*3/uL (ref 0–0.7)
EOS PCT: 1 %
HCT: 34.9 % — ABNORMAL LOW (ref 40.0–52.0)
HEMOGLOBIN: 11.8 g/dL — AB (ref 13.0–18.0)
LYMPHS PCT: 12 %
Lymphs Abs: 1.3 10*3/uL (ref 1.0–3.6)
MCH: 29.5 pg (ref 26.0–34.0)
MCHC: 34 g/dL (ref 32.0–36.0)
MCV: 86.7 fL (ref 80.0–100.0)
Monocytes Absolute: 0.8 10*3/uL (ref 0.2–1.0)
Monocytes Relative: 7 %
NEUTROS PCT: 79 %
Neutro Abs: 8.8 10*3/uL — ABNORMAL HIGH (ref 1.4–6.5)
PLATELETS: 266 10*3/uL (ref 150–440)
RBC: 4.02 MIL/uL — AB (ref 4.40–5.90)
RDW: 15 % — ABNORMAL HIGH (ref 11.5–14.5)
WBC: 11 10*3/uL — AB (ref 3.8–10.6)

## 2016-06-02 LAB — URINALYSIS COMPLETE WITH MICROSCOPIC (ARMC ONLY)
Bilirubin Urine: NEGATIVE
Glucose, UA: NEGATIVE mg/dL
KETONES UR: NEGATIVE mg/dL
Nitrite: POSITIVE — AB
PH: 5 (ref 5.0–8.0)
PROTEIN: 30 mg/dL — AB
SQUAMOUS EPITHELIAL / LPF: NONE SEEN
Specific Gravity, Urine: 1.025 (ref 1.005–1.030)

## 2016-06-02 LAB — COMPREHENSIVE METABOLIC PANEL
ALK PHOS: 67 U/L (ref 38–126)
ALT: 31 U/L (ref 17–63)
AST: 44 U/L — AB (ref 15–41)
Albumin: 2.8 g/dL — ABNORMAL LOW (ref 3.5–5.0)
Anion gap: 7 (ref 5–15)
BUN: 25 mg/dL — AB (ref 6–20)
CHLORIDE: 104 mmol/L (ref 101–111)
CO2: 28 mmol/L (ref 22–32)
CREATININE: 0.82 mg/dL (ref 0.61–1.24)
Calcium: 8.5 mg/dL — ABNORMAL LOW (ref 8.9–10.3)
GFR calc Af Amer: >60 mL/min (ref >60)
GFR calc non Af Amer: >60 mL/min (ref >60)
GLUCOSE: 102 mg/dL — AB (ref 65–99)
Potassium: 3.6 mmol/L (ref 3.5–5.1)
SODIUM: 139 mmol/L (ref 135–145)
Total Bilirubin: 0.6 mg/dL (ref 0.3–1.2)
Total Protein: 6.9 g/dL (ref 6.5–8.1)

## 2016-06-02 MED ORDER — CEFTRIAXONE SODIUM-DEXTROSE 1-3.74 GM-% IV SOLR
1.0000 g | Freq: Once | INTRAVENOUS | Status: AC
  Administered 2016-06-02: 1 g via INTRAVENOUS
  Filled 2016-06-02: qty 50

## 2016-06-02 MED ORDER — SODIUM CHLORIDE 0.9 % IV BOLUS (SEPSIS)
500.0000 mL | Freq: Once | INTRAVENOUS | Status: AC
  Administered 2016-06-02: 500 mL via INTRAVENOUS

## 2016-06-02 MED ORDER — DEXTROSE 5 % IV SOLN: 1.0000 g | Freq: Once | INTRAVENOUS | Status: DC

## 2016-06-02 NOTE — ED Triage Notes (Signed)
[facility].  Staff reports increased ability of patient to follow instructions and to ambulate over the past week.  Staff reports that today at 2:45pm when she entered patient's room he was lying on the floor.  Bed is approx. 2 feet off the ground and staff is unsure if patient laid on the floor or fell on the floor from the bed.  No obvious injury or deformity.  Patient is oriented to self only and is obviously confused.  Staff told EMS that patient would need to be transferred to a nursing facility because patient's care needs were too extensive for their facility.

## 2016-06-02 NOTE — H&P (Signed)
[facility] -  at [facility]   PATIENT NAME: Cody Chaney    MR#:  [id number]  DATE OF BIRTH:  1935-08-09  DATE OF ADMISSION:  06/02/2016  PRIMARY CARE PHYSICIAN: Arlyss Queen, MD   REQUESTING/REFERRING PHYSICIAN: Huel Cote, M.D.  CHIEF COMPLAINT:   Chief Complaint  Patient presents with  . Altered Mental Status  . Fall    HISTORY OF PRESENT ILLNESS:  Cody Chaney  is a 80 y.o. male who presents with Change in mental status. Patient lives at a group home and had an acute change in his level of confusion today. He was sent to the ED for evaluation and was found here to have a UTI. We've been unable to contact any family. Hospitals were called for admission for treatment of UTI, and likely related encephalopathy.  PAST MEDICAL HISTORY:   Past Medical History:  Diagnosis Date  . Coronary artery disease   . Dementia   . Heart murmur   . Hyperlipemia   . Hypertension   . Hypertrophy (benign) of prostate     PAST SURGICAL HISTORY:   Past Surgical History:  Procedure Laterality Date  . NO PAST SURGERIES      SOCIAL HISTORY:   Social History  Substance Use Topics  . Smoking status: Never Smoker  . Smokeless tobacco: Never Used  . Alcohol use No    FAMILY HISTORY:   Family History  Problem Relation Age of Onset  . Family history unknown: Yes    DRUG ALLERGIES:  No Known Allergies  MEDICATIONS AT HOME:   Prior to Admission medications   Medication Sig Start Date End Date Taking? Authorizing Provider  acetaminophen (TYLENOL) 500 MG tablet Take 500-1,000 mg by mouth every 6 (six) hours as needed for mild pain, moderate pain or fever.   Yes Historical Provider, MD  alum & mag hydroxide-simeth (MAALOX/MYLANTA) 200-200-20 MG/5ML suspension Take 10 mLs by mouth 4 (four) times daily as needed for indigestion or heartburn.   Yes Historical Provider, MD  amLODipine (NORVASC) 2.5 MG tablet Take 2.5 mg by mouth daily.   Yes  Historical Provider, MD  aspirin 81 MG tablet Take 81 mg by mouth daily.   Yes Historical Provider, MD  atorvastatin (LIPITOR) 80 MG tablet Take 80 mg by mouth at bedtime.    Yes Historical Provider, MD  dextromethorphan-guaiFENesin (ROBITUSSIN-DM) 10-100 MG/5ML liquid Take 10 mLs by mouth every 4 (four) hours as needed for cough.   Yes Historical Provider, MD  furosemide (LASIX) 20 MG tablet Take 20 mg by mouth daily.   Yes Historical Provider, MD  magnesium hydroxide (MILK OF MAGNESIA) 400 MG/5ML suspension Take 30 mLs by mouth daily as needed for mild constipation.   Yes Historical Provider, MD  metoprolol succinate (TOPROL-XL) 25 MG 24 hr tablet Take 25 mg by mouth daily.   Yes Historical Provider, MD  petrolatum-hydrophilic-aloe vera (ALOE VESTA) ointment Apply topically as needed for wound care. Apply daily as needed to rectal area with each change of brief   Yes Historical Provider, MD  Skin Protectants, Misc. (DIMETHICONE-ZINC OXIDE) cream Apply topically daily as needed for dry skin.   Yes Historical Provider, MD    REVIEW OF SYSTEMS:  Review of Systems  Unable to perform ROS: Acuity of condition     VITAL SIGNS:   Vitals:   06/02/16 2000 06/02/16 2021 06/02/16 2042 06/02/16 2130  BP: 119/85  128/84 131/87  Pulse:   72   Resp: 18  (!) 24 19  Temp:      TempSrc:      SpO2: 97% 97% 98% 98%  Weight:      Height:       Wt Readings from Last 3 Encounters:  06/02/16 63.5 kg (140 lb)  01/27/16 61.9 kg (136 lb 8 oz)  12/19/15 64.9 kg (143 lb)    PHYSICAL EXAMINATION:  Physical Exam  Vitals reviewed. Constitutional: He appears well-developed and well-nourished. No distress.  HENT:  Head: Normocephalic and atraumatic.  Mouth/Throat: Oropharynx is clear and moist.  Eyes: Conjunctivae and EOM are normal. Pupils are equal, round, and reactive to light. No scleral icterus.  Neck: Normal range of motion. Neck supple. No JVD present. No thyromegaly present.  Cardiovascular:  Normal rate, regular rhythm and intact distal pulses.  Exam reveals no gallop and no friction rub.   No murmur heard. Respiratory: Effort normal and breath sounds normal. No respiratory distress. He has no wheezes. He has no rales.  GI: Soft. Bowel sounds are normal. He exhibits no distension. There is no tenderness.  Musculoskeletal: Normal range of motion. He exhibits no edema.  No arthritis, no gout  Lymphadenopathy:    He has no cervical adenopathy.  Neurological: He is alert. No cranial nerve deficit.  Unable to fully assess due to patient's condition  Skin: Skin is warm and dry. No rash noted. No erythema.  Psychiatric:  Unable to assess due to patient condition    LABORATORY PANEL:   CBC  Recent Labs Lab 06/02/16 1746  WBC 11.0*  HGB 11.8*  HCT 34.9*  PLT 266   ------------------------------------------------------------------------------------------------------------------  Chemistries   Recent Labs Lab 06/02/16 1746  NA 139  K 3.6  CL 104  CO2 28  GLUCOSE 102*  BUN 25*  CREATININE 0.82  CALCIUM 8.5*  AST 44*  ALT 31  ALKPHOS 67  BILITOT 0.6   ------------------------------------------------------------------------------------------------------------------  Cardiac Enzymes  Recent Labs Lab 06/02/16 2035  TROPONINI 0.03*   ------------------------------------------------------------------------------------------------------------------  RADIOLOGY:  Ct Head Wo Contrast  Result Date: 06/02/2016 CLINICAL DATA:  Patient found on floor. Unwitnessed fall. Confusion. Concern for head injury. Initial encounter. EXAM: CT HEAD WITHOUT CONTRAST TECHNIQUE: Contiguous axial images were obtained from the base of the skull through the vertex without intravenous contrast. COMPARISON:  None. FINDINGS: Brain: No evidence of acute infarction, hemorrhage, hydrocephalus, extra-axial collection or mass lesion/mass effect. Prominence of the ventricles and sulci reflects  moderately severe cortical volume loss. Cerebellar atrophy is noted. Scattered periventricular and subcortical white matter change likely reflects small vessel ischemic microangiopathy. A chronic lacunar infarct is noted at the left basal ganglia. The brainstem and fourth ventricle are within normal limits. The cerebral hemispheres demonstrate grossly normal gray-white differentiation. No mass effect or midline shift is seen. Vascular: No hyperdense vessel or unexpected calcification. Skull: There is no evidence of fracture; visualized osseous structures are unremarkable in appearance. Sinuses/Orbits: The visualized portions of the orbits are within normal limits. The paranasal sinuses and mastoid air cells are well-aerated. Other: No significant soft tissue abnormalities are seen. IMPRESSION: 1. No acute intracranial pathology seen on CT. 2. Moderately severe cortical volume loss and scattered small vessel ischemic microangiopathy. 3. Chronic lacunar infarct at the left basal ganglia. Electronically Signed   By: Roanna RaiderJeffery  Chang M.D.   On: 06/02/2016 18:23    EKG:   Orders placed or performed during the hospital encounter of 01/27/16  . ED EKG  . ED EKG    IMPRESSION AND PLAN:  Principal Problem:   UTI (  urinary tract infection) - IV antibiotics, cultures sent from ED Active Problems:   Acute encephalopathy - related to UTI above, expect improvement to patient's baseline with treatment   HTN (hypertension) - stable, continue home meds   CAD (coronary artery disease) - continue home medications   Dementia - likely contributing to patient's mental status change, continue to monitor   HLD (hyperlipidemia) - home meds  All the records are reviewed and case discussed with ED provider. Management plans discussed with the patient and/or family.  DVT PROPHYLAXIS: SubQ lovenox  GI PROPHYLAXIS: PPI  ADMISSION STATUS: Inpatient  CODE STATUS: Full, though unable to confirm this with any family  members. This will need to be readdressed once family can be contacted. Code Status History    This patient does not have a recorded code status. Please follow your organizational policy for patients in this situation.      TOTAL TIME TAKING CARE OF THIS PATIENT: 45 minutes.    Caedyn Raygoza FIELDING 06/02/2016, 9:51 PM  [facility]  Office  [phone]  CC: Primary care physician; Arlyss QueenSELVIDGE,WILLIAM M, MD

## 2016-06-02 NOTE — ED Provider Notes (Signed)
Time Seen: Approximately 1748  I have reviewed the triage notes  Chief Complaint: Altered Mental Status and Fall   History of Present Illness: Cody Chaney is a 80 y.o. male who presents from local family care home with EMS describing her history that the patient's had decreased ambulation and participation and they'll daycare. They expressed concerns the patient may need to be placed in a nursing facility. Patient has a history of dementia and is unable to offer any significant history or review of systems. He denies any pain. He apparently was found today beside the bed and fallen. It's unknown whether or not he had a syncopal episode. No obvious trauma could be identified at the scene were initially on triage here in emergency department. Patient denies any pain but otherwise again is unable to offer any significant review of systems and is only oriented to name only. No family is available to be interviewed at this time   Past Medical History:  Diagnosis Date  . Coronary artery disease   . Dementia   . Heart murmur   . Hyperlipemia   . Hypertension   . Hypertrophy (benign) of prostate     Patient Active Problem List   Diagnosis Date Noted  . Acute encephalopathy 06/02/2016  . UTI (urinary tract infection) 06/02/2016  . HTN (hypertension) 06/02/2016  . HLD (hyperlipidemia) 06/02/2016  . CAD (coronary artery disease) 06/02/2016  . Dementia 06/02/2016    Past Surgical History:  Procedure Laterality Date  . NO PAST SURGERIES      Past Surgical History:  Procedure Laterality Date  . NO PAST SURGERIES      Current Outpatient Rx  . Order #: [id number] Class: Historical Med  . Order #: [id number] Class: Historical Med  . Order #: [id number] Class: Historical Med  . Order #: [id number] Class: Historical Med  . Order #: [id number] Class: Historical Med  . Order #: [id number] Class: Historical Med  . Order #: [id number] Class: Historical Med  . Order #: [id number] Class: Historical  Med  . Order #: [id number] Class: Historical Med  . Order #: [id number] Class: Historical Med  . Order #: [id number] Class: Historical Med    Allergies:  Review of patient's allergies indicates no known allergies.  Family History: Family History  Problem Relation Age of Onset  . Family history unknown: Yes    Social History: Social History  Substance Use Topics  . Smoking status: Never Smoker  . Smokeless tobacco: Never Used  . Alcohol use No     Review of Systems:   10 point review of systems was performed and was otherwise negative: Review of systems acquired from the nursing home per EMS Constitutional: No fever Eyes: No visual disturbances ENT: No sore throat, ear pain Cardiac: No chest pain Respiratory: No shortness of breath, wheezing, or stridor Abdomen: No abdominal pain, no vomiting, No diarrhea Endocrine: No weight loss, No night sweats Extremities: No peripheral edema, cyanosis Skin: No rashes, easy bruising Neurologic: Generalized nonfocal weakness Urologic: No dysuria, Hematuria, or urinary frequency   Physical Exam:  ED Triage Vitals  Enc Vitals Group     BP 06/02/16 1741 136/76     Pulse Rate 06/02/16 1741 88     Resp 06/02/16 1741 18     Temp 06/02/16 1741 98.2 F (36.8 C)     Temp Source 06/02/16 1741 Oral     SpO2 06/02/16 1741 97 %     Weight 06/02/16 1745 140 lb (63.5 kg)     Height 06/02/16 1745 6\' 2"  (1.88  m)     Head Circumference --      Peak Flow --      Pain Score --      Pain Loc --      Pain Edu? --      Excl. in GC? --     General: Awake , Alert , and Oriented times 1, Glasgow Coma Scale 15 Head: Normal cephalic , atraumatic Eyes: Pupils equal , round, reactive to light Nose/Throat: No nasal drainage, patent upper airway without erythema or exudate.  Neck: Supple, Full range of motion, No anterior adenopathy or palpable thyroid masses no cervical spine tenderness Lungs: Clear to ascultation without wheezes , rhonchi, or  rales Heart: Regular rate, regular rhythm without murmurs , gallops , or rubs Abdomen: Soft, non tender without rebound, guarding , or rigidity; bowel sounds positive and symmetric in all 4 quadrants. No organomegaly .        Extremities: 2 plus symmetric pulses. No edema, clubbing or cyanosis Neurologic: Patient withdraws from stimulation of both lower extremities. Much more on the right than the left lower extremity. Sensory seems to be intact and Babinski is downgoing bilaterally  Skin: warm, dry, no rashes   Labs:   All laboratory work was reviewed including any pertinent negatives or positives listed below:  Labs Reviewed  CBC WITH DIFFERENTIAL/PLATELET - Abnormal; Notable for the following:       Result Value   WBC 11.0 (*)    RBC 4.02 (*)    Hemoglobin 11.8 (*)    HCT 34.9 (*)    RDW 15.0 (*)    Neutro Abs 8.8 (*)    All other components within normal limits  COMPREHENSIVE METABOLIC PANEL - Abnormal; Notable for the following:    Glucose, Bld 102 (*)    BUN 25 (*)    Calcium 8.5 (*)    Albumin 2.8 (*)    AST 44 (*)    All other components within normal limits  TROPONIN I - Abnormal; Notable for the following:    Troponin I 0.04 (*)    All other components within normal limits  URINALYSIS COMPLETEWITH MICROSCOPIC ([facility] ONLY) - Abnormal; Notable for the following:    Color, Urine AMBER (*)    APPearance CLOUDY (*)    Hgb urine dipstick 2+ (*)    Protein, ur 30 (*)    Nitrite POSITIVE (*)    Leukocytes, UA TRACE (*)    Bacteria, UA MANY (*)    All other components within normal limits  TROPONIN I - Abnormal; Notable for the following:    Troponin I 0.03 (*)    All other components within normal limits  URINE CULTURE    EKG:  ED ECG REPORT I, Jennye MoccasinBrian S Nylen Creque, the attending physician, personally viewed and interpreted this ECG.  Date: 06/02/2016 EKG Time: 1750Rate: 76 Rhythm: normal sinus rhythm QRS Axis: normal Intervals: normal ST/T Wave abnormalities:  Nonspecific ST-T wave abnormalities Conduction Disturbances: none Narrative Interpretation: unremarkable No obvious acute ischemic pattern Poor R-wave progression in the septal and anterior leads.   Radiology:  "Ct Head Wo Contrast  Result Date: 06/02/2016 CLINICAL DATA:  Patient found on floor. Unwitnessed fall. Confusion. Concern for head injury. Initial encounter. EXAM: CT HEAD WITHOUT CONTRAST TECHNIQUE: Contiguous axial images were obtained from the base of the skull through the vertex without intravenous contrast. COMPARISON:  None. FINDINGS: Brain: No evidence of acute infarction, hemorrhage, hydrocephalus, extra-axial collection or mass lesion/mass effect. Prominence of the ventricles and  sulci reflects moderately severe cortical volume loss. Cerebellar atrophy is noted. Scattered periventricular and subcortical white matter change likely reflects small vessel ischemic microangiopathy. A chronic lacunar infarct is noted at the left basal ganglia. The brainstem and fourth ventricle are within normal limits. The cerebral hemispheres demonstrate grossly normal gray-white differentiation. No mass effect or midline shift is seen. Vascular: No hyperdense vessel or unexpected calcification. Skull: There is no evidence of fracture; visualized osseous structures are unremarkable in appearance. Sinuses/Orbits: The visualized portions of the orbits are within normal limits. The paranasal sinuses and mastoid air cells are well-aerated. Other: No significant soft tissue abnormalities are seen. IMPRESSION: 1. No acute intracranial pathology seen on CT. 2. Moderately severe cortical volume loss and scattered small vessel ischemic microangiopathy. 3. Chronic lacunar infarct at the left basal ganglia. Electronically Signed   By: Roanna Raider M.D.   On: 06/02/2016 18:23  "  I personally reviewed the radiologic studies    ED Course:  Patient may have some encephalopathy secondary to what appears to be  urinary tract infection. Urine culture was added. He is afebrile, normal white blood cell count is felt was unlikely was septic at this time. Patient was started on IV Rocephin. Clinical Course     Assessment: Urinary tract infection Acute encephalopathy Dementia   Final Clinical Impression:  Final diagnoses:  Altered mental status, unspecified altered mental status type     Plan:  Inpatient           Jennye Moccasin, MD 06/02/16 2226

## 2016-06-03 LAB — BASIC METABOLIC PANEL
Anion gap: 7 (ref 5–15)
BUN: 19 mg/dL (ref 6–20)
CALCIUM: 8.2 mg/dL — AB (ref 8.9–10.3)
CO2: 27 mmol/L (ref 22–32)
CREATININE: 0.78 mg/dL (ref 0.61–1.24)
Chloride: 106 mmol/L (ref 101–111)
Glucose, Bld: 90 mg/dL (ref 65–99)
Potassium: 3.5 mmol/L (ref 3.5–5.1)
SODIUM: 140 mmol/L (ref 135–145)

## 2016-06-03 LAB — TROPONIN I
TROPONIN I: 0.04 ng/mL — AB (ref <0.03)
Troponin I: 0.04 ng/mL (ref <0.03)
Troponin I: 0.03 ng/mL (ref <0.03)

## 2016-06-03 LAB — CBC
HCT: 34.2 % — ABNORMAL LOW (ref 40.0–52.0)
Hemoglobin: 11.7 g/dL — ABNORMAL LOW (ref 13.0–18.0)
MCH: 29.6 pg (ref 26.0–34.0)
MCHC: 34.2 g/dL (ref 32.0–36.0)
MCV: 86.6 fL (ref 80.0–100.0)
PLATELETS: 248 10*3/uL (ref 150–440)
RBC: 3.95 MIL/uL — AB (ref 4.40–5.90)
RDW: 14.9 % — ABNORMAL HIGH (ref 11.5–14.5)
WBC: 9.9 10*3/uL (ref 3.8–10.6)

## 2016-06-03 LAB — MRSA PCR SCREENING: MRSA by PCR: NEGATIVE

## 2016-06-03 MED ORDER — CEFTRIAXONE SODIUM-DEXTROSE 2-2.22 GM-% IV SOLR
2.0000 g | INTRAVENOUS | Status: DC
  Administered 2016-06-03 – 2016-06-04 (×2): 2 g via INTRAVENOUS
  Filled 2016-06-03 (×3): qty 50

## 2016-06-03 MED ORDER — ACETAMINOPHEN 325 MG PO TABS: 650.0000 mg | ORAL_TABLET | Freq: Four times a day (QID) | ORAL | Status: DC | PRN

## 2016-06-03 MED ORDER — ENOXAPARIN SODIUM 40 MG/0.4ML ~~LOC~~ SOLN
40.0000 mg | SUBCUTANEOUS | Status: DC
  Administered 2016-06-03 – 2016-06-04 (×2): 40 mg via SUBCUTANEOUS
  Filled 2016-06-03 (×2): qty 0.4

## 2016-06-03 MED ORDER — ATORVASTATIN CALCIUM 20 MG PO TABS
80.0000 mg | ORAL_TABLET | Freq: Every day | ORAL | Status: DC
  Administered 2016-06-03 – 2016-06-04 (×2): 80 mg via ORAL
  Filled 2016-06-03 (×2): qty 4

## 2016-06-03 MED ORDER — AMLODIPINE BESYLATE 5 MG PO TABS
2.5000 mg | ORAL_TABLET | Freq: Every day | ORAL | Status: DC
  Administered 2016-06-03 – 2016-06-05 (×3): 2.5 mg via ORAL
  Filled 2016-06-03 (×3): qty 1

## 2016-06-03 MED ORDER — ACETAMINOPHEN 650 MG RE SUPP: 650.0000 mg | Freq: Four times a day (QID) | RECTAL | Status: DC | PRN

## 2016-06-03 MED ORDER — DEXTROSE 5 % IV SOLN: 2.0000 g | INTRAVENOUS | Status: DC

## 2016-06-03 MED ORDER — SODIUM CHLORIDE 0.9% FLUSH
3.0000 mL | Freq: Two times a day (BID) | INTRAVENOUS | Status: DC
  Administered 2016-06-03 – 2016-06-05 (×5): 3 mL via INTRAVENOUS

## 2016-06-03 MED ORDER — METOPROLOL SUCCINATE ER 25 MG PO TB24
25.0000 mg | ORAL_TABLET | Freq: Every day | ORAL | Status: DC
  Administered 2016-06-03 – 2016-06-05 (×3): 25 mg via ORAL
  Filled 2016-06-03 (×3): qty 1

## 2016-06-03 MED ORDER — SODIUM CHLORIDE 0.9 % IV SOLN
INTRAVENOUS | Status: AC
  Administered 2016-06-03: 02:00:00 via INTRAVENOUS

## 2016-06-03 MED ORDER — ONDANSETRON HCL 4 MG PO TABS: 4.0000 mg | ORAL_TABLET | Freq: Four times a day (QID) | ORAL | Status: DC | PRN

## 2016-06-03 MED ORDER — ASPIRIN 81 MG PO CHEW
81.0000 mg | CHEWABLE_TABLET | Freq: Every day | ORAL | Status: DC
  Administered 2016-06-03 – 2016-06-05 (×3): 81 mg via ORAL
  Filled 2016-06-03 (×3): qty 1

## 2016-06-03 MED ORDER — ONDANSETRON HCL 4 MG/2ML IJ SOLN: 4.0000 mg | Freq: Four times a day (QID) | INTRAMUSCULAR | Status: DC | PRN

## 2016-06-03 NOTE — Progress Notes (Signed)
Patient only minimally interactive today.  Unable to stand with PT evaluation.  SW contacted the group home - they report he was able to walk short distances on and off.

## 2016-06-03 NOTE — Progress Notes (Signed)
Initial Nutrition Assessment  DOCUMENTATION CODES:   Non-severe (moderate) malnutrition in context of chronic illness  INTERVENTION:  -Ensure Enlive po BID, each supplement provides 350 kcal and 20 grams of protein  NUTRITION DIAGNOSIS:   Malnutrition related to chronic illness as evidenced by severe depletion of muscle mass, severe depletion of body fat, moderate depletions of muscle mass.  GOAL:   Patient will meet greater than or equal to 90% of their needs  MONITOR:   PO intake, I & O's, Labs, Weight trends, Supplement acceptance  REASON FOR ASSESSMENT:   Other (Comment) (Low BMI)    ASSESSMENT:   Cody Chaney  is a 80 y.o. male who presents with Change in mental status. Patient lives at a group home and had an acute change in his level of confusion today. He was sent to the ED for evaluation and was found here to have a UTI  Patient has dementia, unable to provide history. No family at bedside. Nutrition-Focused physical exam completed. Findings are moderate-severe fat depletion, moderate-severe muscle depletion, and no edema.  No meal completion documented. Per chart review, exhibits 10#/6.7% insignificant wt loss over 6 months. Labs and medications reviewed  Diet Order:  Diet Heart Room service appropriate? Yes; Fluid consistency: Thin  Skin:  Reviewed, no issues  Last BM:  PTA  Height:   Ht Readings from Last 1 Encounters:  06/02/16 6\' 2"  (1.88 m)    Weight:   Wt Readings from Last 1 Encounters:  06/02/16 140 lb (63.5 kg)    Ideal Body Weight:  86.36 kg  BMI:  Body mass index is 17.97 kg/m.  Estimated Nutritional Needs:   Kcal:  1600-1900 calories  Protein:  64-76 gm  Fluid:  >/= 1.6L  EDUCATION NEEDS:   No education needs identified at this time  Dionne AnoWilliam M. Danis Pembleton, MS, RD LDN Inpatient Clinical Dietitian Pager [phone]

## 2016-06-03 NOTE — Care Management (Signed)
Patient is from [facility].  He has dementia. There is a csw consult present.  It is documented that the family care home has stated they are not able to meet patient's care needs.  Being treated for UTI with IV antibiotics

## 2016-06-03 NOTE — [place] FL2 (Signed)
[place] MEDICAID FL2 LEVEL OF CARE SCREENING TOOL     IDENTIFICATION  Patient Name: [name] E Koker Birthdate: 04-Nov-1935 Sex: male Admission Date (Current Location): 06/02/2016  [place]  [place] [id number] [facility] Facility and Address:  [facility], [address], [place], [place] [address]      Provider Number: [id number]  Attending Physician Name and Address:  Adrian SaranSital Mody, MD  Relative Name and Phone Number:  Ward Givenshorton,Tammy  [phone] or Raelyn NumberHORTON,PATRICK  [phone]     Current Level of Care: Hospital Recommended Level of Care: Skilled Nursing Facility Prior Approval Number:    Date Approved/Denied:   PASRR Number: Pending  Discharge Plan: SNF    Current Diagnoses: Patient Active Problem List   Diagnosis Date Noted  . Acute encephalopathy 06/02/2016  . UTI (urinary tract infection) 06/02/2016  . HTN (hypertension) 06/02/2016  . HLD (hyperlipidemia) 06/02/2016  . CAD (coronary artery disease) 06/02/2016  . Dementia 06/02/2016    Orientation RESPIRATION BLADDER Height & Weight     Self  Normal Incontinent Weight: 140 lb (63.5 kg) Height:  6\' 2"  (188 cm)  BEHAVIORAL SYMPTOMS/MOOD NEUROLOGICAL BOWEL NUTRITION STATUS      Continent Diet (Heart Healthy)  AMBULATORY STATUS COMMUNICATION OF NEEDS Skin   Limited Assist Verbally Normal                       Personal Care Assistance Level of Assistance  Bathing, Feeding, Dressing Bathing Assistance: Limited assistance Feeding assistance: Limited assistance Dressing Assistance: Limited assistance     Functional Limitations Info  Sight, Hearing, Speech Sight Info: Adequate Hearing Info: Adequate Speech Info: Adequate    SPECIAL CARE FACTORS FREQUENCY  PT (By licensed PT)     PT Frequency: 5x a week              Contractures Contractures Info: Not present    Additional Factors Info  Code Status, Allergies Code Status Info: Full Code Allergies  Info: NKA           Current Medications (06/03/2016):  This is the current hospital active medication list Current Facility-Administered Medications  Medication Dose Route Frequency Provider Last Rate Last Dose  . acetaminophen (TYLENOL) tablet 650 mg  650 mg Oral Q6H PRN Oralia Manisavid Willis, MD       Or  . acetaminophen (TYLENOL) suppository 650 mg  650 mg Rectal Q6H PRN Oralia Manisavid Willis, MD      . amLODipine (NORVASC) tablet 2.5 mg  2.5 mg Oral Daily Oralia Manisavid Willis, MD   2.5 mg at 06/03/16 1001  . aspirin chewable tablet 81 mg  81 mg Oral Daily Oralia Manisavid Willis, MD   81 mg at 06/03/16 1002  . atorvastatin (LIPITOR) tablet 80 mg  80 mg Oral QHS Oralia Manisavid Willis, MD      . cefTRIAXone (ROCEPHIN) IVPB 2 g  2 g Intravenous Q24H Oralia Manisavid Willis, MD      . enoxaparin (LOVENOX) injection 40 mg  40 mg Subcutaneous Q24H Oralia Manisavid Willis, MD      . metoprolol succinate (TOPROL-XL) 24 hr tablet 25 mg  25 mg Oral Daily Oralia Manisavid Willis, MD   25 mg at 06/03/16 1002  . ondansetron (ZOFRAN) tablet 4 mg  4 mg Oral Q6H PRN Oralia Manisavid Willis, MD       Or  . ondansetron [facility](ZOFRAN) injection 4 mg  4 mg Intravenous Q6H PRN Oralia Manisavid Willis, MD      . sodium chloride flush (NS) 0.9 % injection 3  mL  3 mL Intravenous Q12H Oralia Manisavid Willis, MD   3 mL at 06/03/16 1030     Discharge Medications: Please see discharge summary for a list of discharge medications.  Relevant Imaging Results:  Relevant Lab Results:   Additional Information SSN [id number]  Darleene Cleavernterhaus, Kalea Perine R

## 2016-06-03 NOTE — Progress Notes (Signed)
Pharmacy Antibiotic Note  Cody Chaney is a 80 y.o. male admitted on 06/02/2016 with UTI.  Pharmacy has been consulted for ceftriaxone dosing.  Plan: Ceftriaxone 2 grams q 24 hours ordered.  Height: 6\' 2"  (188 cm) Weight: 140 lb (63.5 kg) IBW/kg (Calculated) : 82.2  Temp (24hrs), Avg:98.5 F (36.9 C), Min:98.2 F (36.8 C), Max:98.9 F (37.2 C)   Recent Labs Lab 06/02/16 1746  WBC 11.0*  CREATININE 0.82    Estimated Creatinine Clearance: 66.7 mL/min (by C-G formula based on SCr of 0.82 mg/dL).    No Known Allergies  Antimicrobials this admission: ceftriaxone 11/1 >>    >>   Dose adjustments this admission:   Microbiology results:  11/1 UCx: pending    Thank you for allowing pharmacy to be a part of this patient's care.  Lamika Connolly S 06/03/2016 1:02 AM

## 2016-06-03 NOTE — Progress Notes (Addendum)
[facility] [place] [facility]   PATIENT NAME: Cody MonacoWeldon Dethloff    MR#:  [id number]  DATE OF BIRTH:  September 22, 1935  SUBJECTIVE:   Patient is confused has dementia  REVIEW OF SYSTEMS:    Review of Systems  Unable to perform ROS: Dementia    Tolerating Diet: yes      DRUG ALLERGIES:  No Known Allergies  VITALS:  Blood pressure (!) 147/78, pulse 73, temperature 98 F (36.7 C), temperature source Oral, resp. rate 18, height 6\' 2"  (1.88 m), weight 63.5 kg (140 lb), SpO2 91 %.  PHYSICAL EXAMINATION:   Physical Exam  Constitutional: He is well-developed, well-nourished, and in no distress. No distress.  HENT:  Head: Normocephalic.  Eyes: No scleral icterus.  Neck: Normal range of motion. Neck supple. No JVD present. No tracheal deviation present.  Cardiovascular: Normal rate and regular rhythm.  Exam reveals no gallop and no friction rub.   Murmur heard. Pulmonary/Chest: Effort normal and breath sounds normal. No respiratory distress. He has no wheezes. He has no rales. He exhibits no tenderness.  Abdominal: Soft. Bowel sounds are normal. He exhibits no distension and no mass. There is no tenderness. There is no rebound and no guarding.  Musculoskeletal: Normal range of motion. He exhibits no edema.  Rigid LE  Neurological: He is alert.  Skin: Skin is warm. No rash noted. No erythema.      LABORATORY PANEL:   CBC  Recent Labs Lab 06/03/16 0724  WBC 9.9  HGB 11.7*  HCT 34.2*  PLT 248   ------------------------------------------------------------------------------------------------------------------  Chemistries   Recent Labs Lab 06/02/16 1746 06/03/16 0724  NA 139 140  K 3.6 3.5  CL 104 106  CO2 28 27  GLUCOSE 102* 90  BUN 25* 19  CREATININE 0.82 0.78  CALCIUM 8.5* 8.2*  AST 44*  --   ALT 31  --   ALKPHOS 67  --   BILITOT 0.6  --     ------------------------------------------------------------------------------------------------------------------  Cardiac Enzymes  Recent Labs Lab 06/02/16 2035 06/03/16 0137 06/03/16 0724  TROPONINI 0.03* 0.04* 0.03*   ------------------------------------------------------------------------------------------------------------------  RADIOLOGY:  Ct Head Wo Contrast  Result Date: 06/02/2016 CLINICAL DATA:  Patient found on floor. Unwitnessed fall. Confusion. Concern for head injury. Initial encounter. EXAM: CT HEAD WITHOUT CONTRAST TECHNIQUE: Contiguous axial images were obtained from the base of the skull through the vertex without intravenous contrast. COMPARISON:  None. FINDINGS: Brain: No evidence of acute infarction, hemorrhage, hydrocephalus, extra-axial collection or mass lesion/mass effect. Prominence of the ventricles and sulci reflects moderately severe cortical volume loss. Cerebellar atrophy is noted. Scattered periventricular and subcortical white matter change likely reflects small vessel ischemic microangiopathy. A chronic lacunar infarct is noted at the left basal ganglia. The brainstem and fourth ventricle are within normal limits. The cerebral hemispheres demonstrate grossly normal gray-white differentiation. No mass effect or midline shift is seen. Vascular: No hyperdense vessel or unexpected calcification. Skull: There is no evidence of fracture; visualized osseous structures are unremarkable in appearance. Sinuses/Orbits: The visualized portions of the orbits are within normal limits. The paranasal sinuses and mastoid air cells are well-aerated. Other: No significant soft tissue abnormalities are seen. IMPRESSION: 1. No acute intracranial pathology seen on CT. 2. Moderately severe cortical volume loss and scattered small vessel ischemic microangiopathy. 3. Chronic lacunar infarct at the left basal ganglia. Electronically Signed   By: Roanna RaiderJeffery  Chang M.D.   On: 06/02/2016  18:23     ASSESSMENT AND PLAN:  80 year old male from [facility] who presents with altered mental status and falls and found to have urinary tract infection.  1. Acute metabolic encephalopathy in the setting of urinary tract infection and underlying dementia: Continue Rocephin and follow up on final urine culture.   2. UTI: Continue Rocephin and follow up on urine culture  3. Hyperlipidemia: Continue atorvastatin  4. Essential hypertension: Continue low-dose Norvasc and metoprolol.  Physical therapy for disposition most likely will need skilled nursing facility   Management plans discussed with the patient's daughter in law and she is in agreement.  CODE STATUS: FULL  TOTAL TIME TAKING CARE OF THIS PATIENT: 30 minutes.     POSSIBLE D/C 1-2 days, DEPENDING ON CLINICAL CONDITION.   Miche Loughridge M.D on 06/03/2016 at 11:39 AM  Between 7am to 6pm - Pager - [phone] After 6pm go to [email or web address] - password [email or web address]  [facility]  Office  [phone]  CC: Primary care physician; Arlyss QueenSELVIDGE,WILLIAM M, MD  Note: This dictation was prepared with Dragon dictation along with smaller phrase technology. Any transcriptional errors that result from this process are unintentional.

## 2016-06-03 NOTE — Evaluation (Signed)
Physical Therapy Evaluation Patient Details Name: Cody Chaney MRN: [id number] DOB: 11-03-1935 Today's Date: 06/03/2016   History of Present Illness  Pt is a 80 y.o. male presenting to hospital after being found on floor beside bed (fall?), AMS, and decreased ambulation past week.  Head CT (-) for acute intracranial abnormality.  Pt admitted to hospital with acute encephalopathy secondary to UTI.  PMH includes CAD, dementia, heart murmur, htn, and HLD.  Clinical Impression  Prior to admission, per notes pt appeared to be ambulatory but with decline in last week.  [facility]  Currently pt requires significant 2 person assist with bed mobility and to attempt standing with RW (pt unable to come to full upright posture).  Pt initially max assist with sitting balance d/t significant posterior lean with sitting up on edge of bed but with cueing able to progress to close SBA.  Sustained clonus noted with quick stretch L foot/ankle into DF (nursing notified) and pt with difficulty straightening L knee and bringing L ankle into neutral DF position (question tone vs prior ROM deficit).   Pt would benefit from skilled PT to address noted impairments and functional limitations.  Since pt appeared to be ambulatory prior to admission (and appearing to be below baseline functional mobility), recommend pt discharge to STR when medically appropriate.    Follow Up Recommendations SNF    Equipment Recommendations   (TBD)    Recommendations for Other Services       Precautions / Restrictions Precautions Precautions: Fall Restrictions Weight Bearing Restrictions: No      Mobility  Bed Mobility Overal bed mobility: Needs Assistance;+2 for physical assistance Bed Mobility: Supine to Sit;Sit to Supine     Supine to sit: Total assist;+2 for physical assistance Sit to supine: Max assist;+2 for physical assistance   General bed mobility comments: assist for trunk and B LE's;  pt with difficulty initiating to participate in bed mobility  Transfers Overall transfer level: Needs assistance Equipment used: Rolling walker (2 wheeled) Transfers: Sit to/from Stand Sit to Stand: Max assist;+2 physical assistance         General transfer comment: x2 trials; assist to place B feet and also for hand placement; pt unable to straighten knees or extend trunk to come to full stand  Ambulation/Gait             General Gait Details: not appropriate at this time  Stairs            Wheelchair Mobility    Modified Rankin (Stroke Patients Only)       Balance Overall balance assessment: Needs assistance Sitting-balance support: Bilateral upper extremity supported;Feet supported Sitting balance-Leahy Scale: Poor Sitting balance - Comments: Pt initially max assist with significant posterior lean but improved to SBA with extra time and tactile and vc's (x8 minutes treatment) Postural control: Posterior lean Standing balance support: Bilateral upper extremity supported (on RW) Standing balance-Leahy Scale: Zero Standing balance comment: attempted standing with RW                             Pertinent Vitals/Pain Pain Assessment: Faces Faces Pain Scale: No hurt  Vitals (HR and O2) stable and WFL throughout treatment session.    Home Living Family/patient expects to be discharged to:: Group home                 Additional Comments: [facility].  Prior Function     Gait / Transfers Assistance Needed: Per chart pt with decreased ambulation past week.           Hand Dominance        Extremity/Trunk Assessment   Upper Extremity Assessment: Difficult to assess due to impaired cognition           Lower Extremity Assessment: Difficult to assess due to impaired cognition (R DF to neutral; L DF to grossly 30 degrees short of neutral; R LE ROM WFL PROM; L LE knee extension 30 degrees short of neutral (good L knee  flexion ROM); sustained clonus noted L LE with quick stretch to ankle)      Cervical / Trunk Assessment: Kyphotic  Communication   Communication: No difficulties  Cognition Arousal/Alertness: Lethargic (Pt initially sleeping but then woke and participatory) Behavior During Therapy: Impulsive Overall Cognitive Status: Difficult to assess                      General Comments General comments (skin integrity, edema, etc.): Pt sleeping in bed upon arrival.  Nursing cleared pt for participation in physical therapy.  Pt agreeable to PT session once woken.     Exercises  Sitting balance and transfer training.   Assessment/Plan    PT Assessment Patient needs continued PT services  PT Problem List Decreased strength;Decreased range of motion;Decreased activity tolerance;Decreased balance;Decreased mobility;Decreased knowledge of use of DME;Decreased knowledge of precautions          PT Treatment Interventions DME instruction;Gait training;Functional mobility training;Therapeutic activities;Therapeutic exercise;Balance training;Patient/family education    PT Goals (Current goals can be found in the Care Plan section)  Acute Rehab PT Goals Patient Stated Goal: pt did not state any goals PT Goal Formulation: Patient unable to participate in goal setting    Frequency Min 2X/week   Barriers to discharge Decreased caregiver support Level of assist care home able to provide    Co-evaluation               End of Session Equipment Utilized During Treatment: Gait belt Activity Tolerance: Patient tolerated treatment well Patient left: in bed;with bed alarm set;with call bell/phone within reach (in low bed; with fall mats (2) in place (1 on each side of the bed); bed in lowest position) Nurse Communication: Mobility status;Precautions         Time: 1410-1430 PT Time Calculation (min) (ACUTE ONLY): 20 min   Charges:   PT Evaluation $PT Eval Low Complexity: 1  Procedure PT Treatments $Therapeutic Activity: 8-22 mins   PT G CodesHendricks Limes:        [name] 06/03/2016, [name], PT [phone]

## 2016-06-03 NOTE — Clinical Social Work Note (Addendum)
MSW received consult that patient is needing SNF for short term rehab.  MSW has began bed search for SNF for short term rehab.  MSW contacted [facility] and left a message with the administrator, awaiting call back.  [facility] said patient sometimes walks on his own, other times he does not.  Staff reported that they help patient with ADLs and sometimes feeding.  MSW awaiting for bed offers, and will continue to follow patient's progress throughout discharge planning, formal assessment to follow.  Ervin KnackEric R. Hassan Rowannterhaus, MSW [phone]  Mon-Fri 8a-4:30p 06/03/2016 5:14 PM

## 2016-06-04 MED ORDER — MAGNESIUM HYDROXIDE 400 MG/5ML PO SUSP
30.0000 mL | Freq: Two times a day (BID) | ORAL | Status: AC
  Administered 2016-06-04 (×2): 30 mL via ORAL
  Filled 2016-06-04 (×2): qty 30

## 2016-06-04 MED ORDER — CEFUROXIME AXETIL 500 MG PO TABS: 500.0000 mg | ORAL_TABLET | Freq: Two times a day (BID) | ORAL | 0 refills | Status: AC

## 2016-06-04 NOTE — Clinical Social Work Note (Addendum)
MSW received [facility] number for patient which is [phone], patient has been faxed out to SNFs awaiting bed offers.  MSW to continue to follow patient's progress throughout discharge planning.  10:30am  MSW presented bed offers to patient's family and they chose [facility].  MSW contacted [facility] and they can accept patient on Saturday.  Ervin KnackEric R. Carina Chaplin, MSW [phone]  Mon-Fri 8a-4:30p 06/04/2016 9:10 AM

## 2016-06-04 NOTE — Discharge Summary (Addendum)
[facility] - [place] [facility]   PATIENT NAME: Cody Chaney    MR#:  [id number]  DATE OF BIRTH:  1936/03/28  DATE OF ADMISSION:  06/02/2016 ADMITTING PHYSICIAN: Oralia Manisavid Willis, MD  DATE OF DISCHARGE: 06/05/2016  PRIMARY CARE PHYSICIAN: Arlyss QueenSELVIDGE,WILLIAM M, MD    ADMISSION DIAGNOSIS:  Altered mental status, unspecified altered mental status type [R41.82]  DISCHARGE DIAGNOSIS:  Principal Problem:   UTI (urinary tract infection) Active Problems:   Acute encephalopathy   HTN (hypertension)   HLD (hyperlipidemia)   CAD (coronary artery disease)   Dementia   SECONDARY DIAGNOSIS:   Past Medical History:  Diagnosis Date  . Coronary artery disease   . Dementia   . Heart murmur   . Hyperlipemia   . Hypertension   . Hypertrophy (benign) of prostate     HOSPITAL COURSE:   80 year old male from [facility] who presents with altered mental status and falls and found to have urinary tract infection.  1. Acute metabolic encephalopathy in the setting of urinary tract infection and underlying dementia: Treatment for urinary tract infection as outlined below.  2. UTI: He was on Rocephin And is transitioned to Ceftin at discharge.  Urine culture was sent however it has not resulted. 3. Hyperlipidemia: Continue atorvastatin  4. Essential hypertension: Continue Norvasc and metoprolol.   DISCHARGE CONDITIONS AND DIET:   Stable condition on a regular diet   CONSULTS OBTAINED:    DRUG ALLERGIES:  No Known Allergies  DISCHARGE MEDICATIONS:   Current Discharge Medication List    START taking these medications   Details  cefUROXime (CEFTIN) 500 MG tablet Take 1 tablet (500 mg total) by mouth 2 (two) times daily with a meal. Qty: 12 tablet, Refills: 0      CONTINUE these medications which have NOT CHANGED   Details  acetaminophen (TYLENOL) 500 MG tablet Take 500-1,000 mg by mouth every 6 (six) hours as needed for mild pain, moderate pain or  fever.    alum & mag hydroxide-simeth (MAALOX/MYLANTA) 200-200-20 MG/5ML suspension Take 10 mLs by mouth 4 (four) times daily as needed for indigestion or heartburn.    amLODipine (NORVASC) 2.5 MG tablet Take 2.5 mg by mouth daily.    aspirin 81 MG tablet Take 81 mg by mouth daily.    atorvastatin (LIPITOR) 80 MG tablet Take 80 mg by mouth at bedtime.     dextromethorphan-guaiFENesin (ROBITUSSIN-DM) 10-100 MG/5ML liquid Take 10 mLs by mouth every 4 (four) hours as needed for cough.    furosemide (LASIX) 20 MG tablet Take 20 mg by mouth daily.    magnesium hydroxide (MILK OF MAGNESIA) 400 MG/5ML suspension Take 30 mLs by mouth daily as needed for mild constipation.    metoprolol succinate (TOPROL-XL) 25 MG 24 hr tablet Take 25 mg by mouth daily.    petrolatum-hydrophilic-aloe vera (ALOE VESTA) ointment Apply topically as needed for wound care. Apply daily as needed to rectal area with each change of brief    Skin Protectants, Misc. (DIMETHICONE-ZINC OXIDE) cream Apply topically daily as needed for dry skin.              Today   CHIEF COMPLAINT:   Patient with dementia and confusion   VITAL SIGNS:  Blood pressure 130/63, pulse 68, temperature 98.6 F (37 C), temperature source Oral, resp. rate 20, height 6\' 2"  (1.88 m), weight 63.5 kg (140 lb), SpO2 95 %.   REVIEW OF SYSTEMS:  Review of Systems  Unable to perform ROS:  Dementia     PHYSICAL EXAMINATION:  GENERAL:  80 y.o.-year-old patient lying in the bed with no acute distress.  NECK:  Supple, no jugular venous distention. No thyroid enlargement, no tenderness.  LUNGS: Normal breath sounds bilaterally, no wheezing, rales,rhonchi  No use of accessory muscles of respiration.  CARDIOVASCULAR: S1, S2 normal. 4/6 SEM NO rubs, or gallops.  ABDOMEN: Soft, non-tender, non-distended. Bowel sounds present. No organomegaly or mass.  EXTREMITIES: No pedal edema, cyanosis, or clubbing.  PSYCHIATRIC: The patient is Confused   SKIN: No obvious rash, lesion, or ulcer. Bruising noted   poor tone lower extremities  DATA REVIEW:   CBC  Recent Labs Lab 06/03/16 0724  WBC 9.9  HGB 11.7*  HCT 34.2*  PLT 248    Chemistries   Recent Labs Lab 06/02/16 1746 06/03/16 0724  NA 139 140  K 3.6 3.5  CL 104 106  CO2 28 27  GLUCOSE 102* 90  BUN 25* 19  CREATININE 0.82 0.78  CALCIUM 8.5* 8.2*  AST 44*  --   ALT 31  --   ALKPHOS 67  --   BILITOT 0.6  --     Cardiac Enzymes  Recent Labs Lab 06/03/16 0137 06/03/16 0724 06/03/16 1248  TROPONINI 0.04* 0.03* 0.04*    Microbiology Results  @[email or web address] @  RADIOLOGY:  No results found.    Management plans discussed with the patient's family and they are in agreement. Stable for discharge SNF  Patient should follow up with pcp  CODE STATUS:     Code Status Orders        Start     Ordered   06/03/16 0058  Full code  Continuous     06/03/16 0057    Code Status History    Date Active Date Inactive Code Status Order ID Comments User Context   This patient has a current code status but no historical code status.      TOTAL TIME TAKING CARE OF THIS PATIENT: 36 minutes.    Note: This dictation was prepared with Dragon dictation along with smaller phrase technology. Any transcriptional errors that result from this process are unintentional.  Wenona Mayville M.D on 06/05/2016 at 8:46 AM  Between 7am to 6pm - Pager - [phone] After 6pm go to [email or web address] - password [email or web address]  [facility]  Office  [phone]  CC: Primary care physician; Arlyss QueenSELVIDGE,WILLIAM M, MD

## 2016-06-04 NOTE — Clinical Social Work Note (Signed)
Clinical Social Work Assessment  Patient Details  Name: Cody Chaney MRN: [id number] Date of Birth: Jun 08, 1936  Date of referral:  06/03/16               Reason for consult:  Facility Placement                Permission sought to share information with:  Facility Medical sales representativeContact Representative, Family Supports Permission granted to share information::  Yes, Verbal Permission Granted  Name::     Ward Givenshorton,Tammy  [phone]   Agency::  SNF admissions  Relationship::     Contact Information:     Housing/Transportation Living arrangements for the past 2 months:  Group Home Source of Information:  Adult Children Patient Interpreter Needed:  None Criminal Activity/Legal Involvement Pertinent to Current Situation/Hospitalization:  No - Comment as needed Significant Relationships:  Adult Children Lives with:  Other (Comment) [facility]) Do you feel safe going back to the place where you live?  No Need for family participation in patient care:  Yes (Comment) (patient has dementia)  Care giving concerns:  Patient's family feels he needs higher level of care and will need SNF placement.   Social Worker assessment / plan:  Patient is a 80 year old male alert and oriented x1 who is from [facility].  Group home and patient's family expressed he has higher level of care now and the group home can not take him back.  Patient has not been to rehab in the past, MSW explained to patient's family what to expect and how his insurance will pay for stay.  Patient's family expressed that patient needs some rehab and a higher level of care now.  Patient's family was explained what the process is for SNF placement and what to expect from the facility.  Patient's family did not express any other questions or concerns.  Employment status:  Retired Health and safety inspectornsurance information:  [place]  Skilled Nursing Facility Information / Referral to community  resources:  Skilled Nursing Facility  Patient/Family's Response to care:  Patient and family agreeable to going to SNF for rehab and then potentially going to a SNF for long term care placement.  Patient/Family's Understanding of and Emotional Response to Diagnosis, Current Treatment, and Prognosis:  Patient has dementia, patient's family are aware of current diagnosis and treatment plan.  Emotional Assessment Appearance:  Appears stated age Attitude/Demeanor/Rapport:    Affect (typically observed):  Blunt, Appropriate, Calm Orientation:  Oriented to Self Alcohol / Substance use:  Not Applicable Psych involvement (Current and /or in the community):  No (Comment)  Discharge Needs  Concerns to be addressed:  Lack of Support Readmission within the last 30 days:  No Current discharge risk:  Lack of support system Barriers to Discharge:  No Barriers Identified   Darleene Cleavernterhaus, Matalie Romberger R 06/04/2016, 4:52 PM

## 2016-06-04 NOTE — Clinical Social Work Placement (Signed)
   CLINICAL SOCIAL WORK PLACEMENT  NOTE  Date:  06/04/2016  Patient Details  Name: [name] E Allender MRN: [id number] Date of Birth: Mar 12, 1936  Clinical Social Work is seeking post-discharge placement for this patient at the Skilled  Nursing Facility level of care (*CSW will initial, date and re-position this form in  chart as items are completed):  Yes   Patient/family provided with [place] Clinical Social Work Department's list of facilities offering this level of care within the geographic area requested by the patient (or if unable, by the patient's family).  Yes   Patient/family informed of their freedom to choose among providers that offer the needed level of care, that participate in Medicare, Medicaid or managed care program needed by the patient, have an available bed and are willing to accept the patient.  Yes   Patient/family informed of [facility]'s ownership interest in [facility] and [facility], as well as of the fact that they are under no obligation to receive care at these facilities.  PASRR submitted to EDS on 06/03/16     PASRR number received on 06/04/16     Existing PASRR number confirmed on 06/04/16     FL2 transmitted to all facilities in geographic area requested by pt/family on 06/03/16     FL2 transmitted to all facilities within larger geographic area on       Patient informed that his/her managed care company has contracts with or will negotiate with certain facilities, including the following:        Yes   Patient/family informed of bed offers received.  [facility]     Physician recommends and patient chooses bed at      [facility] [place] on  .  Patient to be transferred to facility by       Patient family notified on   of transfer.  Name of family member notified:        PHYSICIAN Please sign FL2     Additional Comment:     _______________________________________________ Darleene CleaverAnterhaus, Viraaj Vorndran R 06/04/2016, 4:59 PM

## 2016-06-04 NOTE — Care Management Important Message (Signed)
Important Message  Patient Details  Name: [name] E Coglianese MRN: [id number] Date of Birth: 1935/12/28   Medicare Important Message Given:  Yes    Eber HongGreene, Izzabelle Bouley R, RN 06/04/2016, 12:14 PM

## 2016-06-04 NOTE — Progress Notes (Addendum)
[facility] [place] [facility]   PATIENT NAME: Cody Chaney    MR#:  [id number]  DATE OF BIRTH:  [birth year]  SUBJECTIVE:   Patient is confused this morning. Events reported  REVIEW OF SYSTEMS:    Review of Systems  Unable to perform ROS: Dementia    Tolerating Diet: yes      DRUG ALLERGIES:  No Known Allergies  VITALS:  Blood pressure 131/74, pulse 68, temperature 99.1 F (37.3 C), temperature source Oral, resp. rate 20, height 6\' 2"  (1.88 Chaney), weight 63.5 kg (140 lb), SpO2 97 %.  PHYSICAL EXAMINATION:   Physical Exam  Constitutional: No distress.  HENT:  Head: Normocephalic.  Eyes: No scleral icterus.  Neck: Neck supple. No JVD present. No tracheal deviation present.  Cardiovascular: Normal rate and regular rhythm.  Exam reveals no gallop and no friction rub.   Murmur heard. Pulmonary/Chest: Effort normal and breath sounds normal. No respiratory distress. He has no wheezes. He has no rales. He exhibits no tenderness.  Abdominal: Soft. Bowel sounds are normal. He exhibits no distension and no mass. There is no tenderness. There is no rebound and no guarding.  Musculoskeletal: Normal range of motion. He exhibits no edema.  Rigid LE  Neurological: He is alert.  Skin: Skin is warm. No rash noted. No erythema.      LABORATORY PANEL:   CBC  Recent Labs Lab 06/03/16 0724  WBC 9.9  HGB 11.7*  HCT 34.2*  PLT 248   ------------------------------------------------------------------------------------------------------------------  Chemistries   Recent Labs Lab 06/02/16 1746 06/03/16 0724  NA 139 140  K 3.6 3.5  CL 104 106  CO2 28 27  GLUCOSE 102* 90  BUN 25* 19  CREATININE 0.82 0.78  CALCIUM 8.5* 8.2*  AST 44*  --   ALT 31  --   ALKPHOS 67  --   BILITOT 0.6  --    ------------------------------------------------------------------------------------------------------------------  Cardiac Enzymes  Recent Labs Lab  06/03/16 0137 06/03/16 0724 06/03/16 1248  TROPONINI 0.04* 0.03* 0.04*   ------------------------------------------------------------------------------------------------------------------  RADIOLOGY:  Ct Head Wo Contrast  Result Date: 06/02/2016 CLINICAL DATA:  Patient found on floor. Unwitnessed fall. Confusion. Concern for head injury. Initial encounter. EXAM: CT HEAD WITHOUT CONTRAST TECHNIQUE: Contiguous axial images were obtained from the base of the skull through the vertex without intravenous contrast. COMPARISON:  None. FINDINGS: Brain: No evidence of acute infarction, hemorrhage, hydrocephalus, extra-axial collection or mass lesion/mass effect. Prominence of the ventricles and sulci reflects moderately severe cortical volume loss. Cerebellar atrophy is noted. Scattered periventricular and subcortical white matter change likely reflects small vessel ischemic microangiopathy. A chronic lacunar infarct is noted at the left basal ganglia. The brainstem and fourth ventricle are within normal limits. The cerebral hemispheres demonstrate grossly normal gray-white differentiation. No mass effect or midline shift is seen. Vascular: No hyperdense vessel or unexpected calcification. Skull: There is no evidence of fracture; visualized osseous structures are unremarkable in appearance. Sinuses/Orbits: The visualized portions of the orbits are within normal limits. The paranasal sinuses and mastoid air cells are well-aerated. Other: No significant soft tissue abnormalities are seen. IMPRESSION: 1. No acute intracranial pathology seen on CT. 2. Moderately severe cortical volume loss and scattered small vessel ischemic microangiopathy. 3. Chronic lacunar infarct at the left basal ganglia. Electronically Signed   By: Roanna RaiderJeffery  Chaney Chaney.D.   On: 06/02/2016 18:23     ASSESSMENT AND PLAN:    80 year old male from [facility] who presents with altered mental status  and falls and found to have urinary tract  infection.  1. Acute metabolic encephalopathy in the setting of urinary tract infection and underlying dementia: Treatment for urinary tract infection as outlined below  2. UTI: Continue Rocephin and follow up on urine culture  3. Hyperlipidemia: Continue atorvastatin  4. Essential hypertension: Continue Norvasc and metoprolol.  Physical therapy is recommending skilled nursing facility at discharge   Management plans discussed with the patient's daughter in law and she is in agreement.  CODE STATUS: FULL  TOTAL TIME TAKING CARE OF THIS PATIENT: 22 minutes.     POSSIBLE D/C tomorrow  DEPENDING ON CLINICAL CONDITION.   Cody Chaney Chaney.D on 06/04/2016 at 11:15 AM  Between 7am to 6pm - Pager - [phone] After 6pm go to [email or web address] - password [email or web address]  [facility]  Office  [phone]  CC: Primary care physician; Cody QueenSELVIDGE,Cody M, MD  Note: This dictation was prepared with Dragon dictation along with smaller phrase technology. Any transcriptional errors that result from this process are unintentional.

## 2016-06-04 NOTE — Progress Notes (Signed)
Pharmacy Antibiotic Note  Cody Chaney is a 80 y.o. male admitted on 06/02/2016 with UTI.  Pharmacy has been consulted for ceftriaxone dosing.  Plan: Urine culture still pending. Continue ceftriaxone 2gm IV Q24H  Height: 6\' 2"  (188 cm) Weight: 140 lb (63.5 kg) IBW/kg (Calculated) : 82.2  Temp (24hrs), Avg:98.2 F (36.8 C), Min:97.6 F (36.4 C), Max:99.1 F (37.3 C)   Recent Labs Lab 06/02/16 1746 06/03/16 0724  WBC 11.0* 9.9  CREATININE 0.82 0.78    Estimated Creatinine Clearance: 68.4 mL/min (by C-G formula based on SCr of 0.78 mg/dL).    No Known Allergies  Antimicrobials this admission: ceftriaxone 11/1 >>    Microbiology results:  11/1 UCx: pending    Thank you for allowing pharmacy to be a part of this patient's care.  Shivaun Bilello C 06/04/2016 9:56 AM

## 2016-06-05 NOTE — Clinical Social Work Note (Signed)
Patient to dc to [facility] via non-emergent EMS due to AMS. The facility and patient's family is aware. CSW will con't to follow pending further dc needs.  Argentina PonderKaren Martha Brylin Stopper, MSW, LCSW-A [phone]

## 2016-06-05 NOTE — Plan of Care (Signed)
Problem: Safety: Goal: Ability to remain free from injury will improve Outcome: Progressing Calm and cooperative this shift.  Remains on low safety bed with padding in floor at bedside;no impulsive attempts to get out of bed.

## 2016-06-05 NOTE — Clinical Social Work Placement (Signed)
   CLINICAL SOCIAL WORK PLACEMENT  NOTE  Date:  06/05/2016  Patient Details  Name: Cody Chaney MRN: [id number] Date of Birth: 1936-01-22  Clinical Social Work is seeking post-discharge placement for this patient at the Skilled  Nursing Facility level of care (*CSW will initial, date and re-position this form in  chart as items are completed):  Yes   Patient/family provided with [place] Clinical Social Work Department's list of facilities offering this level of care within the geographic area requested by the patient (or if unable, by the patient's family).  Yes   Patient/family informed of their freedom to choose among providers that offer the needed level of care, that participate in Medicare, Medicaid or managed care program needed by the patient, have an available bed and are willing to accept the patient.  Yes   Patient/family informed of [facility]'s ownership interest in [facility] and [facility], as well as of the fact that they are under no obligation to receive care at these facilities.  PASRR submitted to EDS on 06/03/16     PASRR number received on 06/04/16     Existing PASRR number confirmed on 06/04/16     FL2 transmitted to all facilities in geographic area requested by pt/family on 06/03/16     FL2 transmitted to all facilities within larger geographic area on       Patient informed that his/her managed care company has contracts with or will negotiate with certain facilities, including the following:        Yes   Patient/family informed of bed offers received.  [facility]     Physician recommends and patient chooses bed at      Patient to be transferred to [facility] [employer] on 06/05/16.  Patient to be transferred to facility by Non-emergent EMS     Patient family notified on 06/05/16 of transfer.  Name of family member notified:  Hardie Shackletonammy Grein     PHYSICIAN       Additional Comment:     _______________________________________________ Judi CongKaren M Kimberly Nieland, LCSW 06/05/2016, 9:48 AM

## 2016-06-05 NOTE — Progress Notes (Signed)
Patient was picked up by EMS to [facility], no distress noted. Discharge packet given to them, report called to RN Selena BattenKim.

## 2016-06-06 LAB — URINE CULTURE

## 2016-09-02 DEATH — deceased

## 2017-10-06 IMAGING — CT CT HEAD W/O CM
1 series · 16 of 30 positions shown, 20 images · non-contrast
Comparison: None.

CLINICAL DATA: Generalized weakness beginning 1-2 days ago. Status
post fall at 6 a.m. this morning. Initial encounter.

EXAM:
CT HEAD WITHOUT CONTRAST
TECHNIQUE: Contiguous axial images were obtained from the base of the skull
through the vertex without intravenous contrast.

[Series 2: head wo · axial · 0.41mm/px · z∈[-111,+39]mm · 16 of 34 slices shown, 20 images]
[im 2/34  brain]
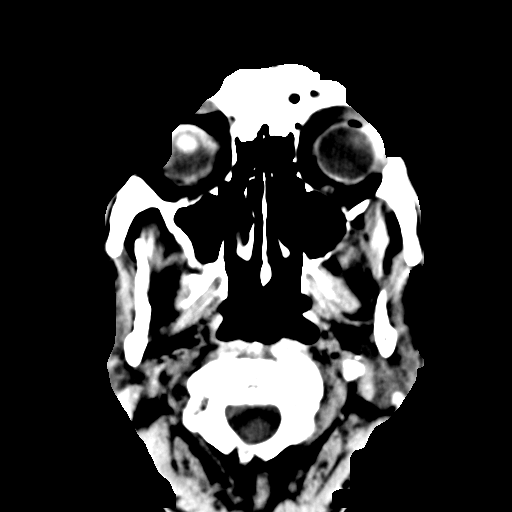
[im 2/34  bone]
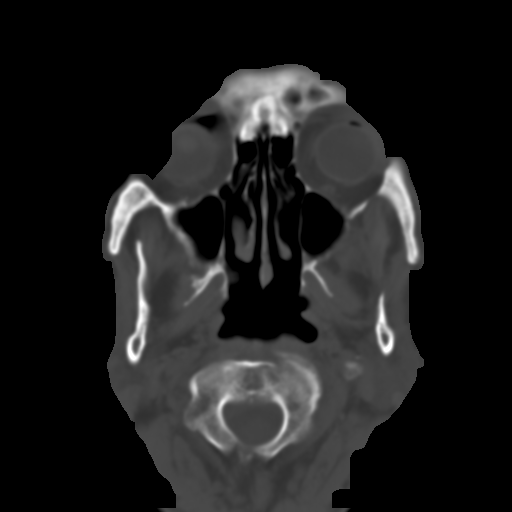
[im 4/34  brain]
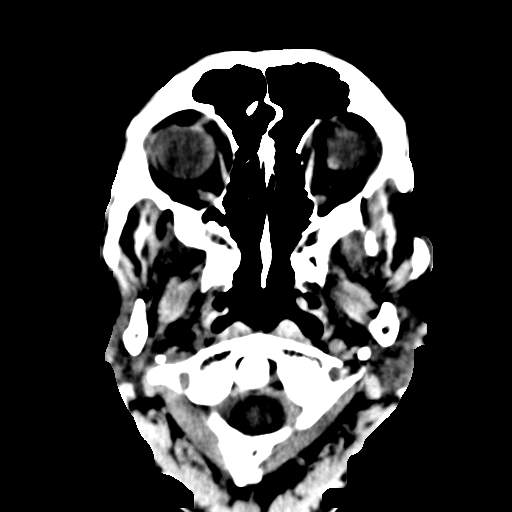
[im 6/34  brain]
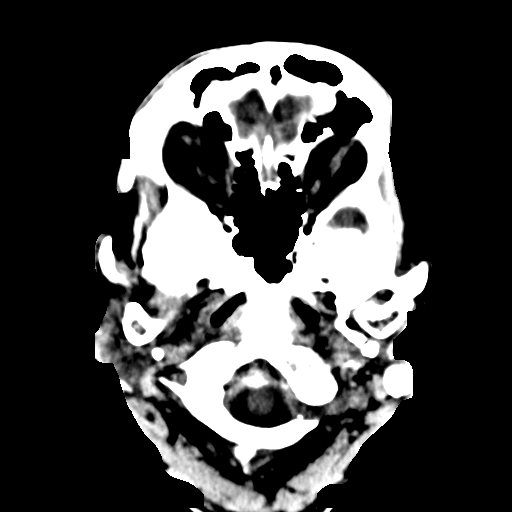
[im 8/34  brain]
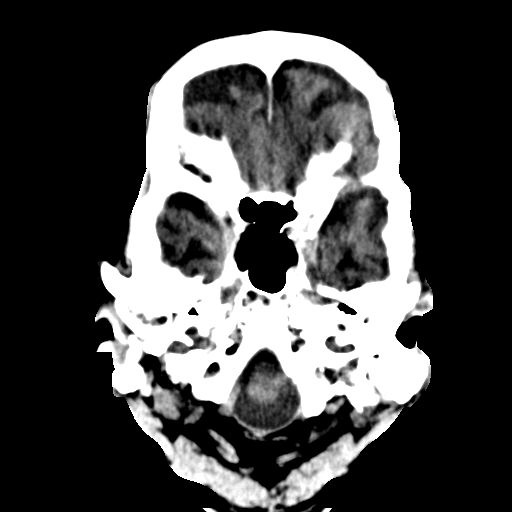
[im 10/34  brain]
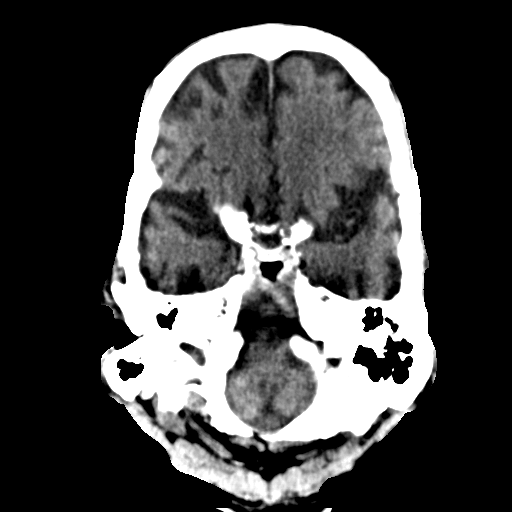
[im 10/34  bone]
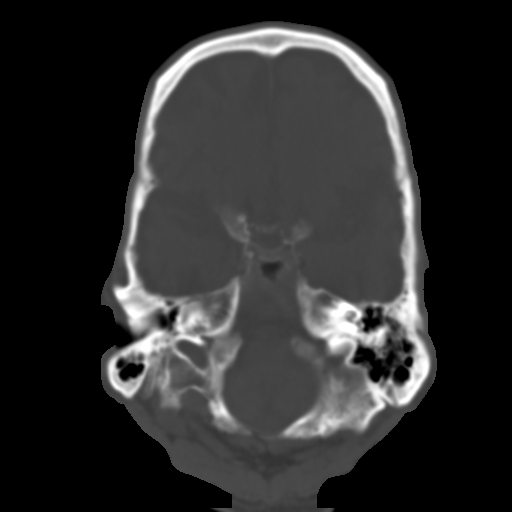
[im 12/34  brain]
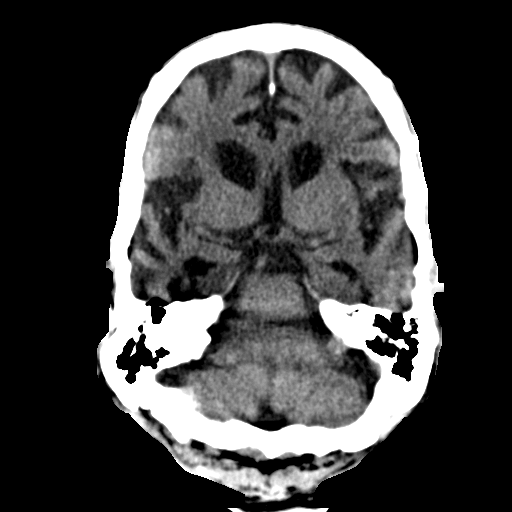
[im 14/34  brain]
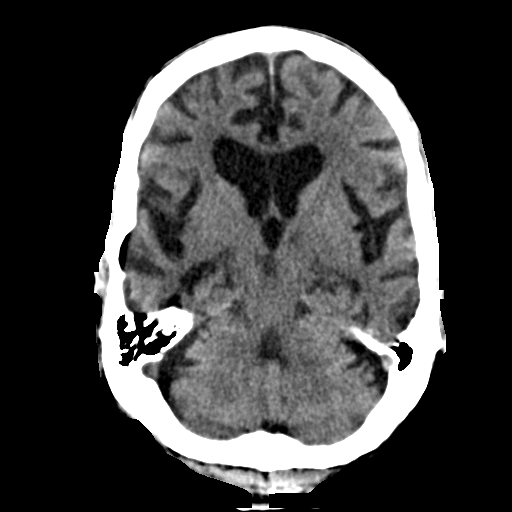
[im 16/34  brain]
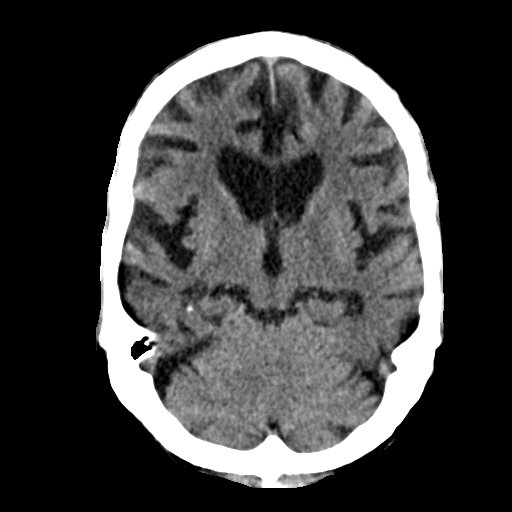
[im 18/34  brain]
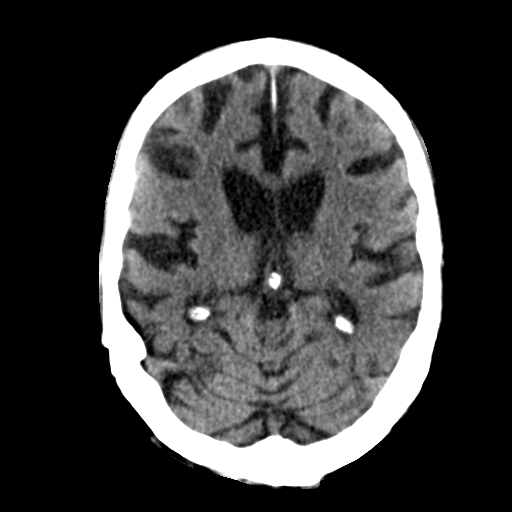
[im 18/34  bone]
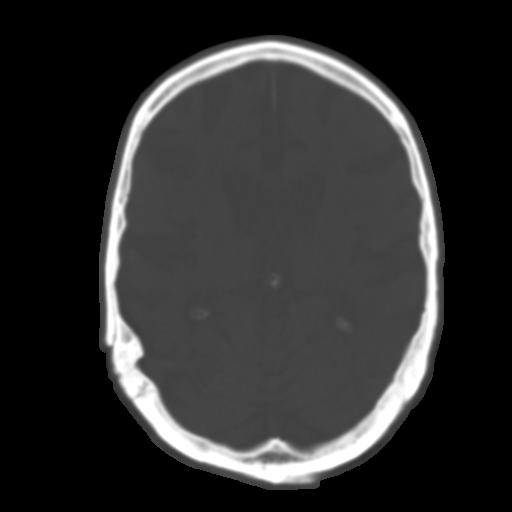
[im 20/34  brain]
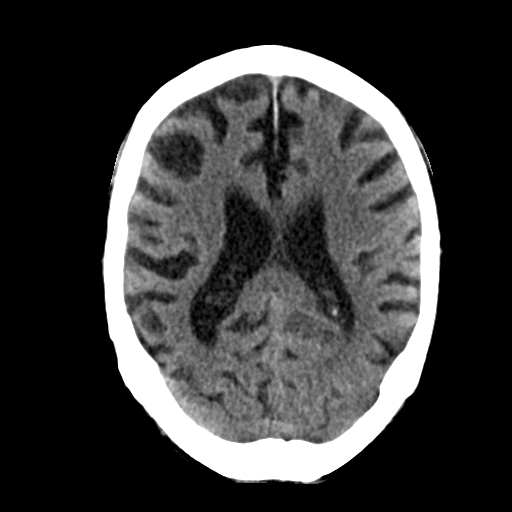
[im 22/34  brain]
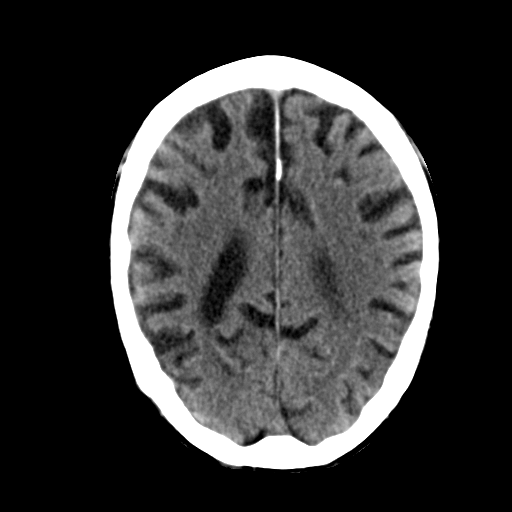
[im 24/34  brain]
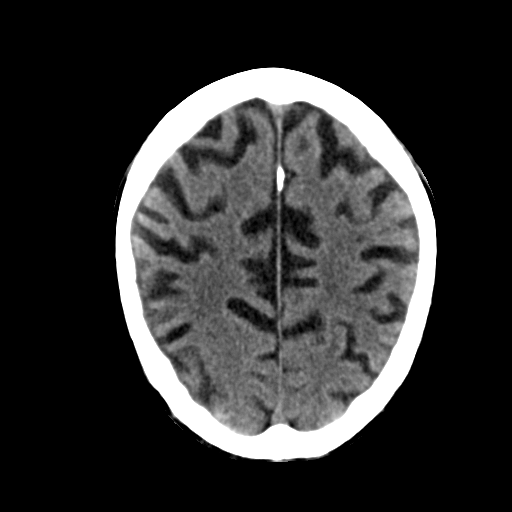
[im 26/34  brain]
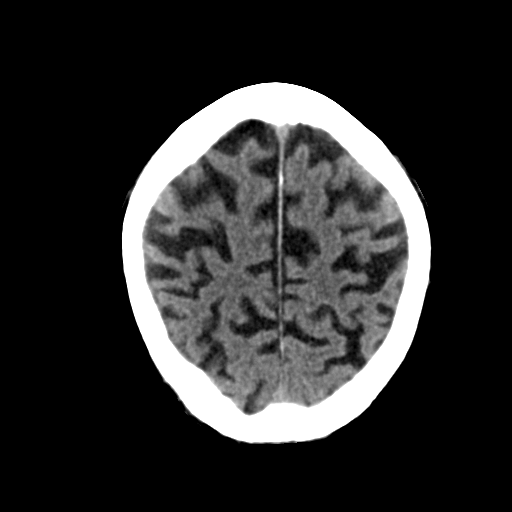
[im 26/34  bone]
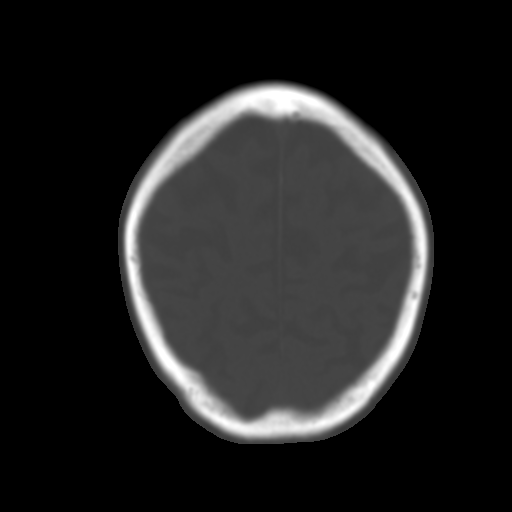
[im 28/34  brain]
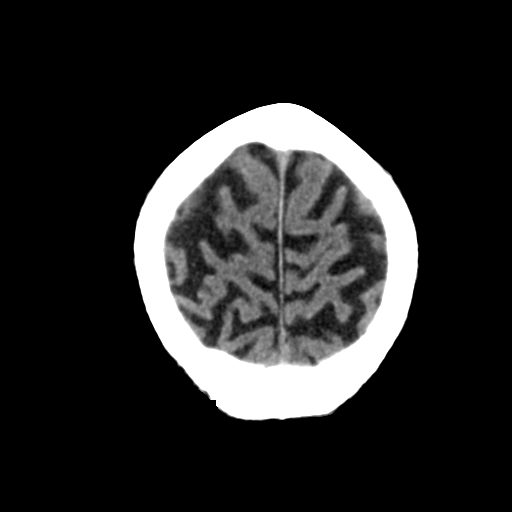
[im 30/34  brain]
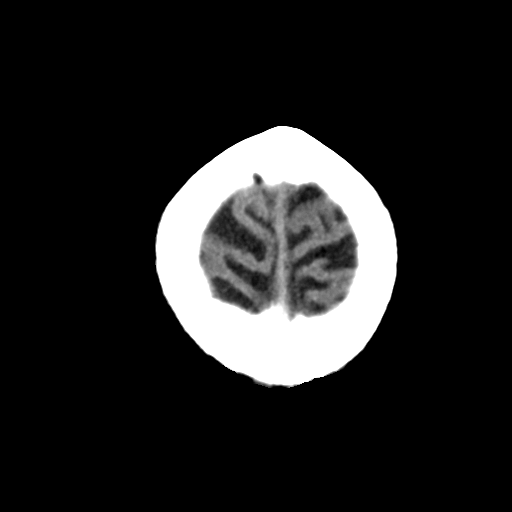
[im 32/34  brain]
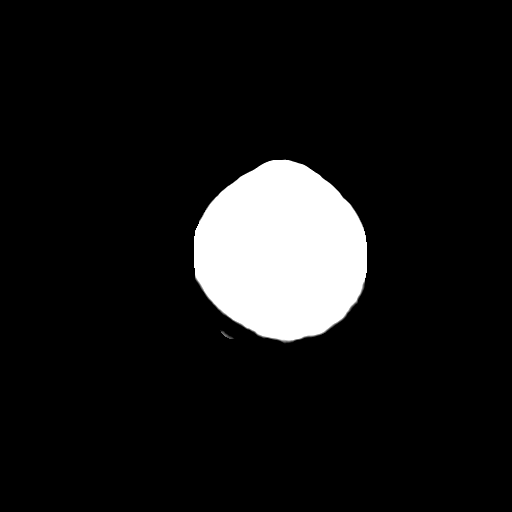

[16 of 30 positions shown; findings below may reference images not displayed]

FINDINGS: There is marked atrophy chronic microvascular ischemic changes seen.
Remote lacunar infarction in the left basal ganglia is noted. No
evidence of acute intracranial abnormality including hemorrhage,
infarct, mass lesion, mass effect, midline shift or abnormal
extra-axial fluid collection. No hydrocephalus or pneumocephalus.
The calvarium is intact. Imaged paranasal sinuses and mastoid air
cells are clear.
IMPRESSION: No acute abnormality.

Chronic microvascular ischemic change and marked cortical atrophy.

## 2018-04-08 IMAGING — CT CT HEAD W/O CM
3 series · 15 of 46 positions shown, 18 images · non-contrast
Comparison: None.

CLINICAL DATA: Patient found on floor. Unwitnessed fall. Confusion.
Concern for head injury. Initial encounter.

EXAM:
CT HEAD WITHOUT CONTRAST
TECHNIQUE: Contiguous axial images were obtained from the base of the skull
through the vertex without intravenous contrast.

[Series 2: head wo · axial · 0.43mm/px · z∈[+177,+297]mm · 9 of 29 slices shown, 12 images]
[im 3/29  brain]
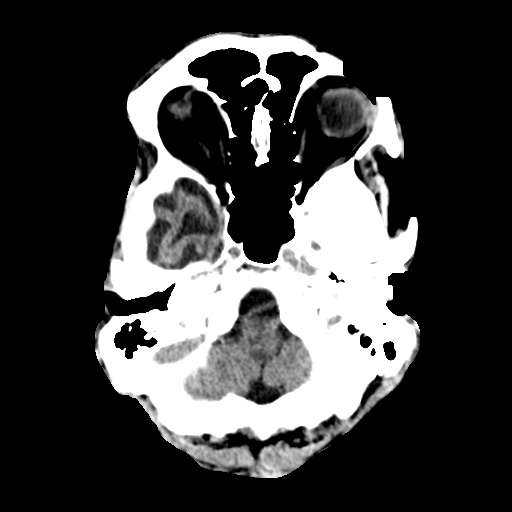
[im 3/29  bone]
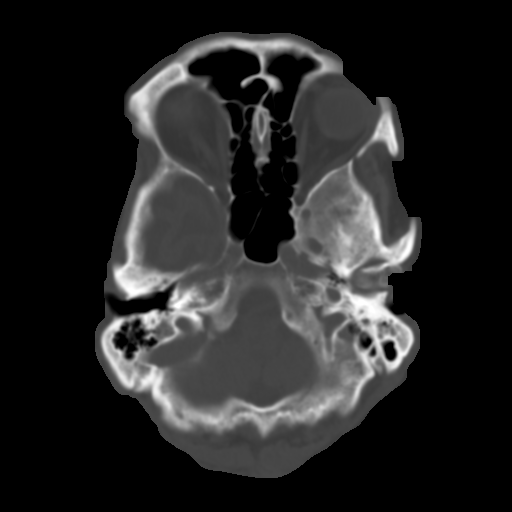
[im 6/29  brain]
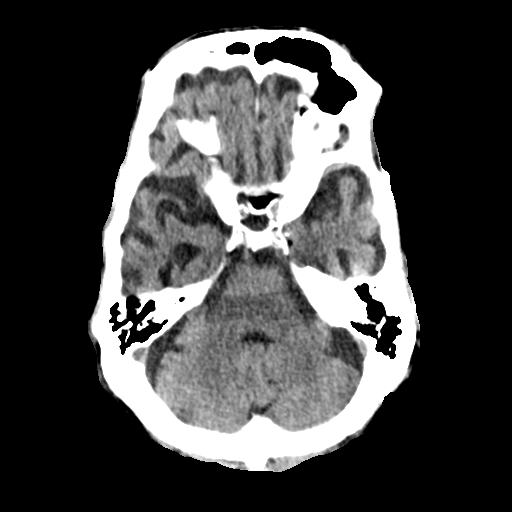
[im 9/29  brain]
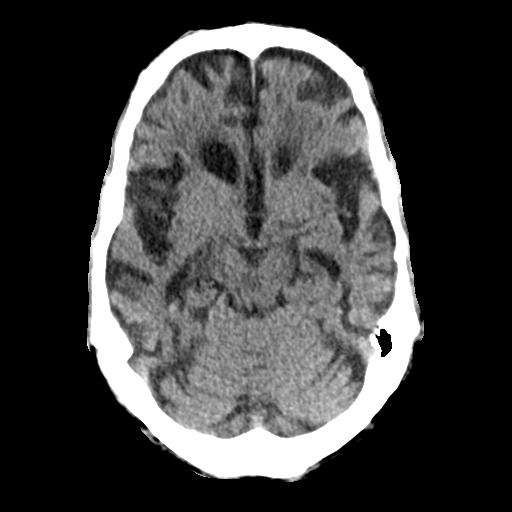
[im 12/29  brain]
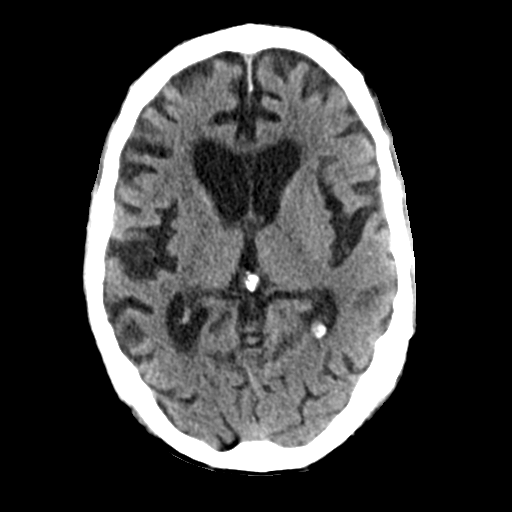
[im 15/29  brain]
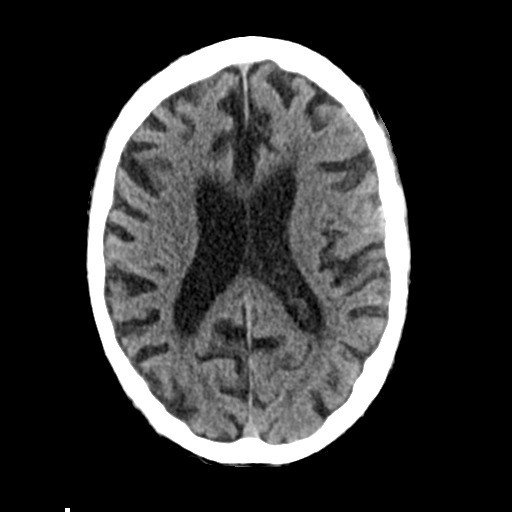
[im 15/29  bone]
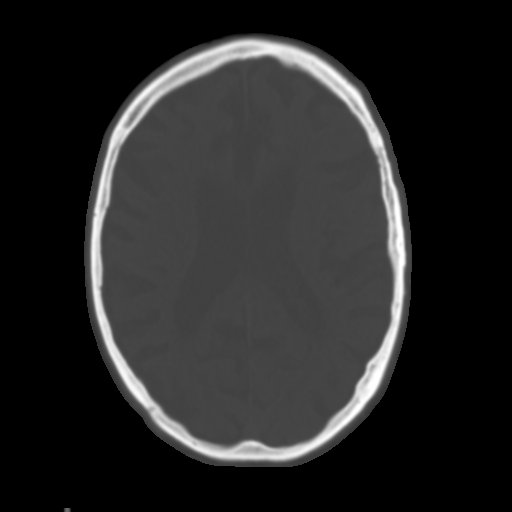
[im 18/29  brain]
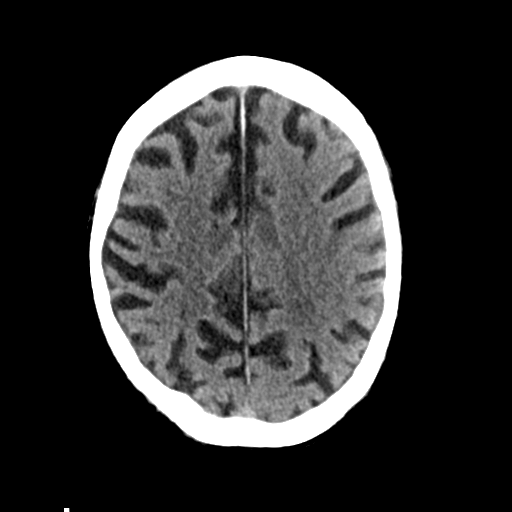
[im 21/29  brain]
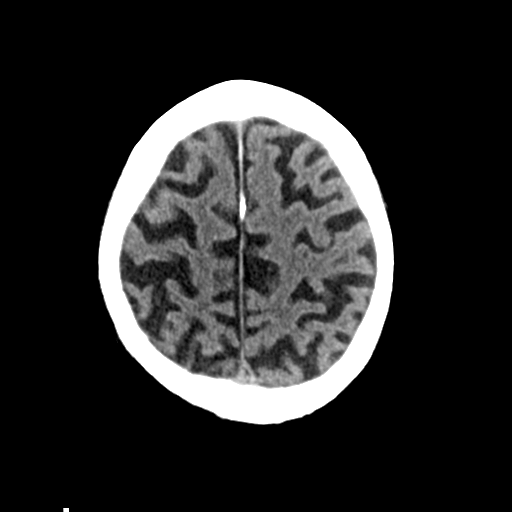
[im 24/29  brain]
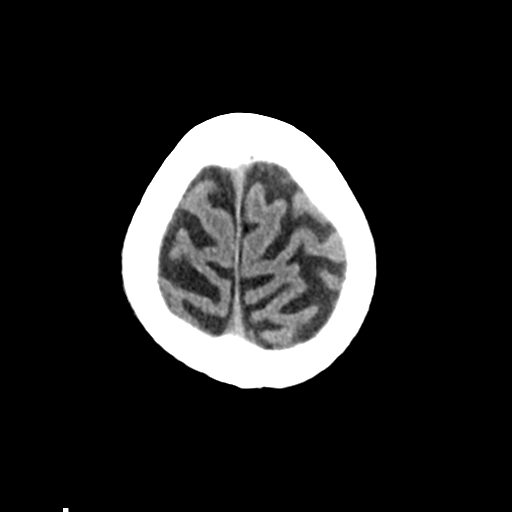
[im 27/29  brain]
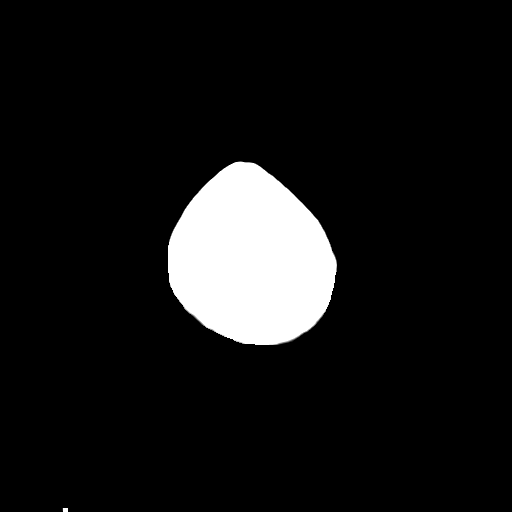
[im 27/29  bone]
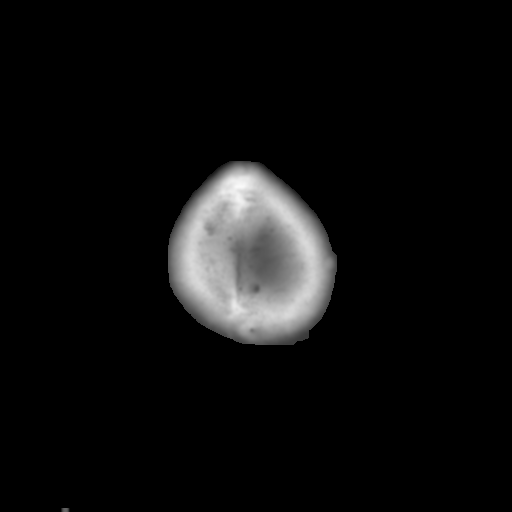

[Series 4: coronal soft tissue · coronal · 0.31mm/px · 3 of 66 slices shown]
[im 22/66  brain]
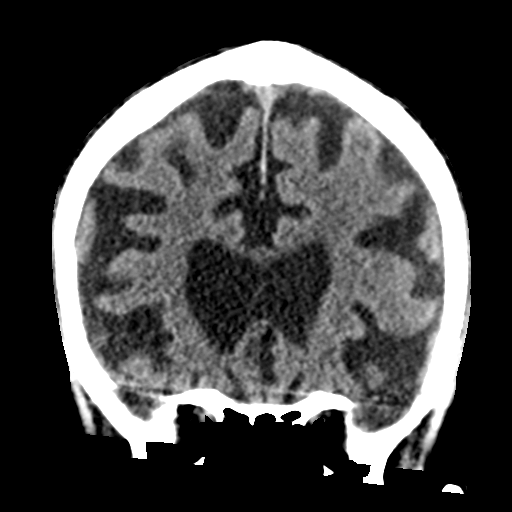
[im 29/66  brain]
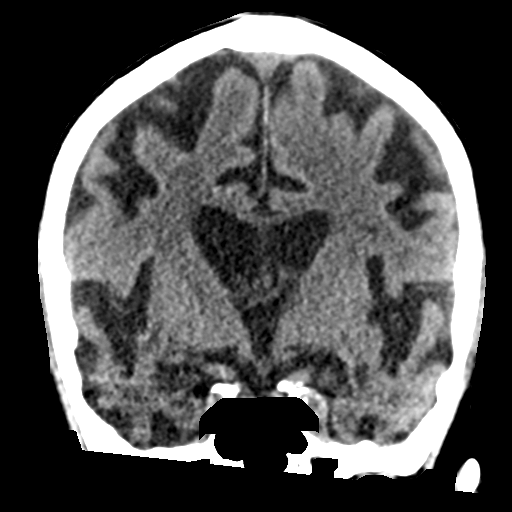
[im 37/66  brain]
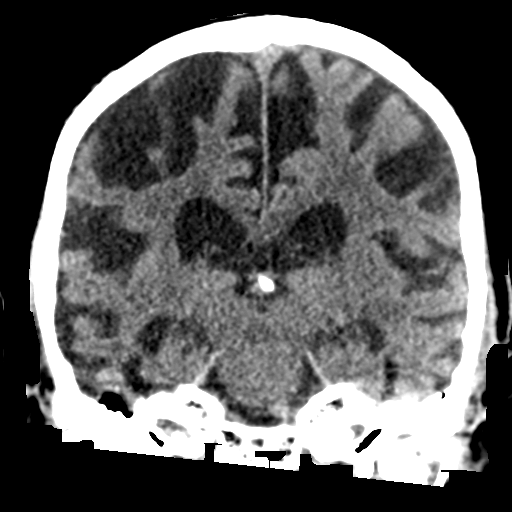

[Series 5: sagittal soft tissue · sagittal · 0.31mm/px · 3 of 52 slices shown]
[im 18/52  brain]
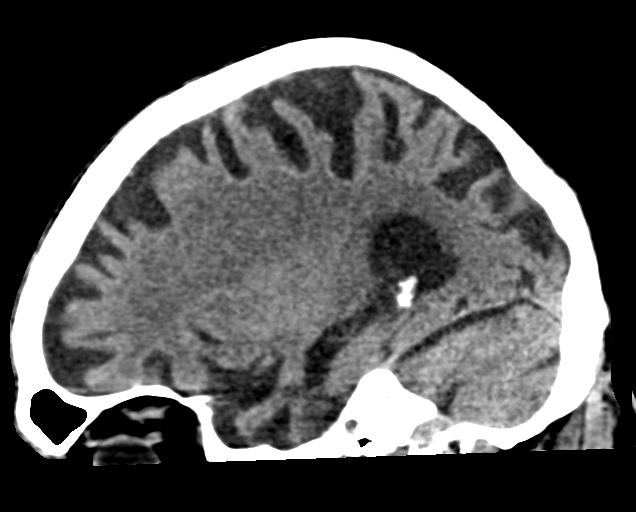
[im 26/52  brain]
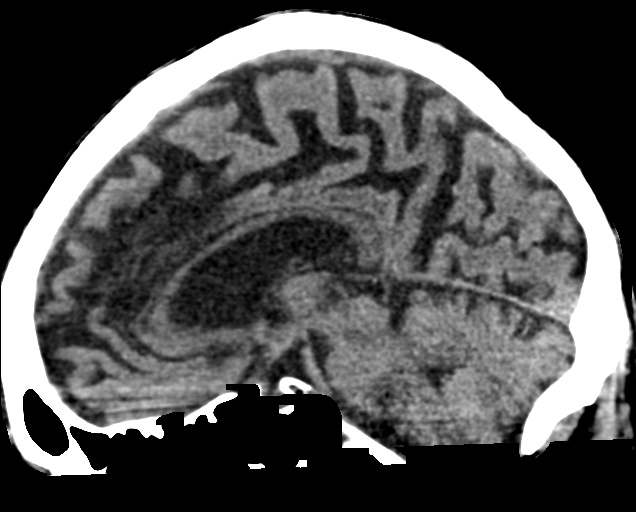
[im 35/52  brain]
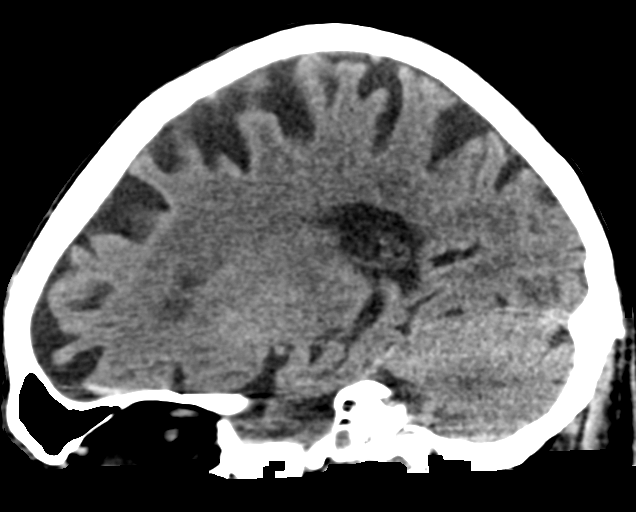

[15 of 46 positions shown; findings below may reference images not displayed]

FINDINGS: Brain: No evidence of acute infarction, hemorrhage, hydrocephalus,
extra-axial collection or mass lesion/mass effect.

Prominence of the ventricles and sulci reflects moderately severe
cortical volume loss. Cerebellar atrophy is noted. Scattered
periventricular and subcortical white matter change likely reflects
small vessel ischemic microangiopathy. A chronic lacunar infarct is
noted at the left basal ganglia.

The brainstem and fourth ventricle are within normal limits. The
cerebral hemispheres demonstrate grossly normal gray-white
differentiation. No mass effect or midline shift is seen.

Vascular: No hyperdense vessel or unexpected calcification.

Skull: There is no evidence of fracture; visualized osseous
structures are unremarkable in appearance.

Sinuses/Orbits: The visualized portions of the orbits are within
normal limits. The paranasal sinuses and mastoid air cells are
well-aerated.

Other: No significant soft tissue abnormalities are seen.
IMPRESSION: 1. No acute intracranial pathology seen on CT.
2. Moderately severe cortical volume loss and scattered small vessel
ischemic microangiopathy.
3. Chronic lacunar infarct at the left basal ganglia.
